# Patient Record
Sex: Female | Born: 1980 | Race: White | Hispanic: No | Marital: Single | Smoking: Never smoker
Health system: Southern US, Community
[De-identification: ages and names within clinical notes are randomized; demographics above are authoritative.]

## PROBLEM LIST (undated history)

## (undated) DIAGNOSIS — G43909 Migraine, unspecified, not intractable, without status migrainosus: Secondary | ICD-10-CM

## (undated) DIAGNOSIS — E738 Other lactose intolerance: Secondary | ICD-10-CM

## (undated) DIAGNOSIS — K589 Irritable bowel syndrome without diarrhea: Secondary | ICD-10-CM

## (undated) DIAGNOSIS — K219 Gastro-esophageal reflux disease without esophagitis: Secondary | ICD-10-CM

## (undated) HISTORY — DX: Other lactose intolerance: E73.8

## (undated) HISTORY — DX: Migraine, unspecified, not intractable, without status migrainosus: G43.909

## (undated) HISTORY — PX: WISDOM TOOTH EXTRACTION: SHX21

---

## 2001-08-07 ENCOUNTER — Other Ambulatory Visit: Admission: RE | Admit: 2001-08-07 | Discharge: 2001-08-07 | Payer: Self-pay | Admitting: Internal Medicine

## 2003-03-12 HISTORY — PX: APPENDECTOMY: SHX54

## 2003-10-03 ENCOUNTER — Encounter: Admission: RE | Admit: 2003-10-03 | Discharge: 2003-10-03 | Payer: Self-pay | Admitting: Internal Medicine

## 2010-04-01 ENCOUNTER — Encounter: Payer: Self-pay | Admitting: Internal Medicine

## 2015-06-08 ENCOUNTER — Ambulatory Visit (INDEPENDENT_AMBULATORY_CARE_PROVIDER_SITE_OTHER): Payer: Managed Care, Other (non HMO) | Admitting: Family Medicine

## 2015-06-08 ENCOUNTER — Encounter: Payer: Self-pay | Admitting: Family Medicine

## 2015-06-08 VITALS — BP 128/82 | HR 85 | Temp 98.5°F | Ht 63.0 in | Wt 164.2 lb

## 2015-06-08 DIAGNOSIS — R5383 Other fatigue: Secondary | ICD-10-CM

## 2015-06-08 DIAGNOSIS — K219 Gastro-esophageal reflux disease without esophagitis: Secondary | ICD-10-CM | POA: Diagnosis not present

## 2015-06-08 DIAGNOSIS — R1013 Epigastric pain: Secondary | ICD-10-CM | POA: Diagnosis not present

## 2015-06-08 NOTE — Progress Notes (Signed)
[facility] [address], [address] [place], [place] [id number] [phone] [phone]  Date:  06/08/2015   Name:  Diane SchatzLauren E Greene   DOB:  Jul 26, 1980   MRN:  [id number]  PCP:  Abbe AmsterdamOPLAND,Rubee Vega, MD    Chief Complaint: Establish Care   History of Present Illness:  Diane SchatzLauren E Greene is a 35 y.o. very pleasant female patient who presents with the following:  She is here today to establish care- she has moved back to [place] recently and needs to find a doctor.   Also, she has noted sx of gastritis with a recent flare up.   On 3/13 she noted heartburn, burping, constipation (which is unusual for her), fatigue, bloating.   She has tried cutting out gluten for the last 2 weeks which did help.    She has had this issue for the last 2 years- she has used prilosec at times which helps temporarily She is on OCP- LMP 3/20  She has never had an upper GI or see gastro She has not been tested for H pylori.    She does get migraines but she has had some other type of headache recently.  Will seem to start with tension in her shoulders and neck, and then will develop pain in her forehead  No vomiting, but she does have some nausea.  At times she may feel like her glucose is low- drinking ginger ale helps her feel better  She states there is no chance of pregnancy.  "i am 100% sure"  There are no active problems to display for this patient.   Past Medical History  Diagnosis Date  . Gastritis   . Other lactose intolerance     dairy sensitivity  . Migraine     Past Surgical History  Procedure Laterality Date  . Appendectomy  2005  . Wisdom tooth extraction      Social History  Substance Use Topics  . Smoking status: Never Smoker   . Smokeless tobacco: Never Used  . Alcohol Use: 0.0 oz/week    0 Standard drinks or equivalent per week     Comment: wine 2-3 drinks monthly    Family History  Problem Relation Age of Onset  . Arthritis Mother    . Arthritis Father     No Known Allergies  Medication list has been reviewed and updated.  No current outpatient prescriptions on file prior to visit.   No current facility-administered medications on file prior to visit.    Review of Systems:  As per HPI- otherwise negative.   Physical Examination: Filed Vitals:   06/08/15 1538  BP: 128/82  Pulse: 85  Temp: 98.5 F (36.9 C)   Filed Vitals:   06/08/15 1538  Height: 5\' 3"  (1.6 m)  Weight: 164 lb 3.2 oz (74.481 kg)   Body mass index is 29.09 kg/(m^2). Ideal Body Weight: Weight in (lb) to have BMI = 25: 140.8  GEN: WDWN, NAD, Non-toxic, A & O x 3, mild overweight, appears healthy  HEENT: Atraumatic, Normocephalic. Neck supple. No masses, No LAD. Ears and Nose: No external deformity. CV: RRR, No M/G/R. No JVD. No thrill. No extra heart sounds. PULM: CTA B, no wheezes, crackles, rhonchi. No retractions. No resp. distress. No accessory muscle use. ABD: S, NT, ND, +BS. No rebound. No HSM.  Benign belly EXTR: No c/c/e NEURO Normal gait.  PSYCH: Normally interactive. Conversant. Not depressed or anxious appearing.  Calm demeanor.  Assessment and Plan: Gastroesophageal reflux disease, esophagitis presence not specified - Plan: H. pylori breath test  Abdominal pain, epigastric - Plan: Amylase  Other fatigue - Plan: CBC with Differential/Platelet, Comprehensive metabolic panel, TSH, B12  Will check basic labs for her today History of vitamin B12 def so will check for her today H pylori testing today- treat if positive, if negative consider GI referral  Signed Abbe Amsterdam, MD

## 2015-06-09 LAB — COMPREHENSIVE METABOLIC PANEL
ALT: 14 U/L (ref 0–35)
AST: 16 U/L (ref 0–37)
Albumin: 4.5 g/dL (ref 3.5–5.2)
Alkaline Phosphatase: 73 U/L (ref 39–117)
BILIRUBIN TOTAL: 0.3 mg/dL (ref 0.2–1.2)
BUN: 12 mg/dL (ref 6–23)
CO2: 28 meq/L (ref 19–32)
CREATININE: 0.73 mg/dL (ref 0.40–1.20)
Calcium: 9.6 mg/dL (ref 8.4–10.5)
Chloride: 101 mEq/L (ref 96–112)
GFR: 97.36 mL/min (ref >60.00)
GLUCOSE: 91 mg/dL (ref 70–99)
Potassium: 4.1 mEq/L (ref 3.5–5.1)
Sodium: 137 mEq/L (ref 135–145)
Total Protein: 7.7 g/dL (ref 6.0–8.3)

## 2015-06-09 LAB — AMYLASE: Amylase: 63 U/L (ref 27–131)

## 2015-06-09 LAB — CBC WITH DIFFERENTIAL/PLATELET
BASOS ABS: 0 10*3/uL (ref 0.0–0.1)
Basophils Relative: 0.4 % (ref 0.0–3.0)
EOS ABS: 0.2 10*3/uL (ref 0.0–0.7)
Eosinophils Relative: 2.7 % (ref 0.0–5.0)
HEMATOCRIT: 38.9 % (ref 36.0–46.0)
Hemoglobin: 13.2 g/dL (ref 12.0–15.0)
LYMPHS ABS: 2.2 10*3/uL (ref 0.7–4.0)
LYMPHS PCT: 29.7 % (ref 12.0–46.0)
MCHC: 34.1 g/dL (ref 30.0–36.0)
MCV: 85.8 fl (ref 78.0–100.0)
Monocytes Absolute: 0.4 10*3/uL (ref 0.1–1.0)
Monocytes Relative: 5.2 % (ref 3.0–12.0)
NEUTROS ABS: 4.6 10*3/uL (ref 1.4–7.7)
NEUTROS PCT: 62 % (ref 43.0–77.0)
PLATELETS: 323 10*3/uL (ref 150.0–400.0)
RBC: 4.53 Mil/uL (ref 3.87–5.11)
RDW: 12.3 % (ref 11.5–15.5)
WBC: 7.4 10*3/uL (ref 4.0–10.5)

## 2015-06-09 LAB — H. PYLORI BREATH TEST: H. pylori Breath Test: NOT DETECTED

## 2015-06-09 LAB — TSH: TSH: 1.41 u[IU]/mL (ref 0.35–4.50)

## 2015-06-09 LAB — VITAMIN B12: Vitamin B-12: 329 pg/mL (ref 211–911)

## 2015-06-09 NOTE — Addendum Note (Signed)
Addended by: Abbe AmsterdamOPLAND, Burleigh Brockmann C on: 06/09/2015 07:33 PM   Modules accepted: Orders

## 2015-06-13 ENCOUNTER — Encounter: Payer: Self-pay | Admitting: Gastroenterology

## 2015-08-09 ENCOUNTER — Other Ambulatory Visit (INDEPENDENT_AMBULATORY_CARE_PROVIDER_SITE_OTHER): Payer: Managed Care, Other (non HMO)

## 2015-08-09 ENCOUNTER — Ambulatory Visit (INDEPENDENT_AMBULATORY_CARE_PROVIDER_SITE_OTHER): Payer: Managed Care, Other (non HMO) | Admitting: Gastroenterology

## 2015-08-09 ENCOUNTER — Encounter: Payer: Self-pay | Admitting: Gastroenterology

## 2015-08-09 VITALS — BP 122/74 | HR 96 | Ht 63.0 in | Wt 163.0 lb

## 2015-08-09 DIAGNOSIS — K219 Gastro-esophageal reflux disease without esophagitis: Secondary | ICD-10-CM

## 2015-08-09 DIAGNOSIS — R140 Abdominal distension (gaseous): Secondary | ICD-10-CM

## 2015-08-09 DIAGNOSIS — K5909 Other constipation: Secondary | ICD-10-CM

## 2015-08-09 LAB — IGA: IGA: 136 mg/dL (ref 68–378)

## 2015-08-09 MED ORDER — RANITIDINE HCL 150 MG PO TABS: 150.0000 mg | ORAL_TABLET | Freq: Two times a day (BID) | ORAL | Status: AC | PRN

## 2015-08-09 MED ORDER — POLYETHYLENE GLYCOL 3350 17 GM/SCOOP PO POWD: 1.0000 | Freq: Every day | ORAL | Status: AC

## 2015-08-09 NOTE — Patient Instructions (Signed)
Your physician has requested that you go to the basement for the following lab work before leaving today:  TTG, IGA  We have sent the following medications to your pharmacy for you to pick up at your convenience:  Miralax, Zantac  You have been given a copy of the FODMAP diet.

## 2015-08-09 NOTE — Progress Notes (Signed)
HPI :  35 y/o female here for a new patient visit for possible IBS / GERD. She has a history of migraine headaches.   Patient reports longstanding "intestinal issues" which have bothered her.  She thinks was told she had IBS when she was in her 4s. She has been seen in urgent care a few years ago for epigastric pain and was told she had "gastritis" although has never had a prior endoscopy.    She does have some pyrosis which bothers her from time to time. Denies regurgitation. No dysphagia. No odynophagia. She has some nausea roughly one day per week, some decrease appetitie with this. No vomiting. She thinks she has lost 10 lbs but trying to lose weight. She has a lot of gas and bloating, with some belching at times. She thinks usually after she eats she can have these symptoms. She also has some "burning" in her epigastric area which can come and go. She has not felt it in a few months but has been on a gluten free diet and not sure if it is related. She usually had the epigastric burning at nighttime, which could bother her sleeping. She has had some nocturnal pyrosis at the same time.  She has roughly one BM per day at baseline. She previously had loose stools but states she has stopped dairy and this has resolved, and now on the constipated side. She takes Allolax PRN for constipation. No blood in the stools. No prior endoscopies. She has some occasiona lower addominal cramps that are relieved reliably with a bowel movement and is stable.   She reports a remote diagnosis of "colitis" in 2005 following her appendectomy, thought to be infectious and treated with antibiotics. Uncle has Crohns disease. Cousin has had colectomy at a young age, unclear etiology. No FH of celiac disease or colon cancer.   She hasn't taken anything for her symptoms, no antacids. She has severe cramps with menses, has menstrual cycle every 3 months, taking Aleve during that time but not otherwise.     Past Medical  History  Diagnosis Date  . Other lactose intolerance     dairy sensitivity  . Migraine      Past Surgical History  Procedure Laterality Date  . Appendectomy  2005  . Wisdom tooth extraction     Family History  Problem Relation Age of Onset  . Arthritis Mother   . Arthritis Father   . Irritable bowel syndrome Sister   . Irritable bowel syndrome Maternal Aunt   . Crohn's disease Paternal Uncle    Social History  Substance Use Topics  . Smoking status: Never Smoker   . Smokeless tobacco: Never Used  . Alcohol Use: 0.0 oz/week    0 Standard drinks or equivalent per week     Comment: wine 2-3 drinks monthly   Current Outpatient Prescriptions  Medication Sig Dispense Refill  . QUASENSE 0.15-0.03 MG tablet Take 1 tablet by mouth daily.  3  . polyethylene glycol powder (GLYCOLAX/MIRALAX) powder Take 255 g by mouth daily. 255 g 3  . ranitidine (ZANTAC) 150 MG tablet Take 1 tablet (150 mg total) by mouth every 12 (twelve) hours as needed for heartburn. 60 tablet 3   No current facility-administered medications for this visit.   No Known Allergies   Review of Systems: All systems reviewed and negative except where noted in HPI.   Lab Results  Component Value Date   WBC 7.4 06/08/2015   HGB 13.2 06/08/2015  HCT 38.9 06/08/2015   MCV 85.8 06/08/2015   PLT 323.0 06/08/2015    Lab Results  Component Value Date   CREATININE 0.73 06/08/2015   BUN 12 06/08/2015   NA 137 06/08/2015   K 4.1 06/08/2015   CL 101 06/08/2015   CO2 28 06/08/2015   Lab Results  Component Value Date   ALT 14 06/08/2015   AST 16 06/08/2015   ALKPHOS 73 06/08/2015   BILITOT 0.3 06/08/2015   H pylori breath test negative March 2017  Physical Exam: BP 122/74 mmHg  Pulse 96  Ht '5\' 3"'$  (1.6 m)  Wt 163 lb (73.936 kg)  BMI 28.88 kg/m2  LMP 05/22/2015 (Approximate) Constitutional: Pleasant,well-developed, female in no acute distress. HEENT: Normocephalic and atraumatic. Conjunctivae are  normal. No scleral icterus. Neck supple.  Cardiovascular: Normal rate, regular rhythm.  Pulmonary/chest: Effort normal and breath sounds normal. No wheezing, rales or rhonchi. Abdominal: Soft, nondistended, nontender. Bowel sounds active throughout. There are no masses palpable. No hepatomegaly. Extremities: no edema Lymphadenopathy: No cervical adenopathy noted. Neurological: Alert and oriented to person place and time. Skin: Skin is warm and dry. No rashes noted. Psychiatric: Normal mood and affect. Behavior is normal.   ASSESSMENT AND PLAN: 35 y/o female with history as above, presenting with intermittent pyrosis / epigastric burning, bloating, and longstanding bowel changes. She has no anemia or alarm features. She has tested negative for H pylori. I suspect GERD may be causing her upper tract symptoms, and she otherwise meets criteria for IBS. Recommend the following:  GERD - discussed options with her, given intermittent nature of symptoms and she doesn't wish to take medication daily, will try zantac PRN. If no improvement we can try PPI. If symptoms persist will consider endoscopy  Suspected IBS / Bloating - improved on gluten free diet but she does endorse gluten ingestion from time to time. Will screen with ABs for celiac. If negative and symptoms persist may consider HLA testing or endoscopy if celiac needs to be more definitively ruled out. We will otherwise try a low FODMOP diet which I counseled her on, and for mild constipation will use Miralax PRN.   She can follow up as needed for persistent symptoms.   Menlo Cellar, MD [facility] Pager [phone]  CC: Copland, Gay Filler, MD

## 2015-08-10 LAB — TISSUE TRANSGLUTAMINASE, IGA: Tissue Transglutaminase Ab, IgA: 1 U/mL (ref <4)

## 2015-09-07 ENCOUNTER — Encounter: Payer: Self-pay | Admitting: *Deleted

## 2015-09-07 ENCOUNTER — Telehealth: Payer: Self-pay | Admitting: Gastroenterology

## 2015-09-07 DIAGNOSIS — R1030 Lower abdominal pain, unspecified: Secondary | ICD-10-CM

## 2015-09-07 NOTE — Telephone Encounter (Signed)
I'm surprised if urgent care thought she had diverticulitis they did not perform a CT scan. Further, amoxicillin is not the treatment of choice for diverticulitis and I'm not sure if they are treating her for something else. Without seeing her in clinic it is hard to say if she needs this. Did they do any blood work? If she has severe pain I guess we can order the CT scan if she wants it but I am not in the office to review the results this afternoon if it is done today.

## 2015-09-07 NOTE — Telephone Encounter (Signed)
Patient aware.

## 2015-09-07 NOTE — Telephone Encounter (Signed)
Ok, if fever or worsening pain, inability to tolerate PO, she will need to go to the ER to get it done sooner. Thanks

## 2015-09-07 NOTE — Telephone Encounter (Signed)
Patient given recommendations. She states her pain is an 8 now. She last ate at 11 today. She wants to have a CT. Left a message for Stacy at [facility] to call back.

## 2015-09-07 NOTE — Telephone Encounter (Signed)
Patient states she woke up at 5 AM with Left side abdominal pain that radiates to lower back. She reports diarrhea and nausea without fever. She went to an Urgent Care this AM for what she thought was a UTI and was told they think it is diverticulitis and that she needed a CT. The urgent care did not order a CT. They gave her Amoxicillin. She is asking if Dr. Adela LankArmbruster would want her to have a CT and if he would order it. Please, advise.

## 2015-09-07 NOTE — Telephone Encounter (Signed)
Patient calling back regarding this. Best # [phone]

## 2015-09-07 NOTE — Telephone Encounter (Signed)
Patient wants to proceed with CT. Scheduled CT at [facility] CT 09/08/15 at 3:30 PM. Patient will pick up contrast and instructions. She will do clear liquids x 24 hours.

## 2015-09-08 ENCOUNTER — Other Ambulatory Visit: Payer: Self-pay | Admitting: *Deleted

## 2015-09-08 ENCOUNTER — Ambulatory Visit (INDEPENDENT_AMBULATORY_CARE_PROVIDER_SITE_OTHER)
Admission: RE | Admit: 2015-09-08 | Discharge: 2015-09-08 | Disposition: A | Discharge status: Home / Self Care | Payer: BLUE CROSS/BLUE SHIELD | Source: Ambulatory Visit | Attending: Gastroenterology | Admitting: Gastroenterology

## 2015-09-08 DIAGNOSIS — R1030 Lower abdominal pain, unspecified: Secondary | ICD-10-CM

## 2015-09-08 MED ORDER — DICYCLOMINE HCL 10 MG PO CAPS: ORAL_CAPSULE | ORAL | Status: DC

## 2015-09-08 MED ORDER — IOPAMIDOL (ISOVUE-300) INJECTION 61%
100.0000 mL | Freq: Once | INTRAVENOUS | Status: AC | PRN
  Administered 2015-09-08: 100 mL via INTRAVENOUS

## 2015-11-15 ENCOUNTER — Ambulatory Visit (INDEPENDENT_AMBULATORY_CARE_PROVIDER_SITE_OTHER): Payer: BLUE CROSS/BLUE SHIELD | Admitting: Family Medicine

## 2015-11-15 ENCOUNTER — Encounter: Payer: Self-pay | Admitting: Family Medicine

## 2015-11-15 VITALS — BP 114/82 | HR 75 | Temp 98.1°F | Ht 64.0 in | Wt 167.4 lb

## 2015-11-15 DIAGNOSIS — Z130 Encounter for screening for diseases of the blood and blood-forming organs and certain disorders involving the immune mechanism: Secondary | ICD-10-CM

## 2015-11-15 DIAGNOSIS — Z0001 Encounter for general adult medical examination with abnormal findings: Secondary | ICD-10-CM | POA: Diagnosis not present

## 2015-11-15 DIAGNOSIS — Z131 Encounter for screening for diabetes mellitus: Secondary | ICD-10-CM | POA: Diagnosis not present

## 2015-11-15 DIAGNOSIS — F40243 Fear of flying: Secondary | ICD-10-CM

## 2015-11-15 DIAGNOSIS — Z13220 Encounter for screening for lipoid disorders: Secondary | ICD-10-CM

## 2015-11-15 DIAGNOSIS — E663 Overweight: Secondary | ICD-10-CM

## 2015-11-15 DIAGNOSIS — Z23 Encounter for immunization: Secondary | ICD-10-CM | POA: Diagnosis not present

## 2015-11-15 DIAGNOSIS — Z0000 Encounter for general adult medical examination without abnormal findings: Secondary | ICD-10-CM

## 2015-11-15 LAB — CBC
HCT: 39.4 % (ref 36.0–46.0)
Hemoglobin: 13.3 g/dL (ref 12.0–15.0)
MCHC: 33.7 g/dL (ref 30.0–36.0)
MCV: 87.2 fl (ref 78.0–100.0)
PLATELETS: 311 10*3/uL (ref 150.0–400.0)
RBC: 4.52 Mil/uL (ref 3.87–5.11)
RDW: 12.8 % (ref 11.5–15.5)
WBC: 5.5 10*3/uL (ref 4.0–10.5)

## 2015-11-15 LAB — LIPID PANEL
CHOL/HDL RATIO: 6
Cholesterol: 265 mg/dL — ABNORMAL HIGH (ref 0–200)
HDL: 42.1 mg/dL (ref >39.00)
LDL Cholesterol: 186 mg/dL — ABNORMAL HIGH (ref 0–99)
NONHDL: 223.18
TRIGLYCERIDES: 187 mg/dL — AB (ref 0.0–149.0)
VLDL: 37.4 mg/dL (ref 0.0–40.0)

## 2015-11-15 LAB — COMPREHENSIVE METABOLIC PANEL
ALK PHOS: 60 U/L (ref 39–117)
ALT: 21 U/L (ref 0–35)
AST: 22 U/L (ref 0–37)
Albumin: 4.2 g/dL (ref 3.5–5.2)
BILIRUBIN TOTAL: 0.3 mg/dL (ref 0.2–1.2)
BUN: 9 mg/dL (ref 6–23)
CALCIUM: 8.9 mg/dL (ref 8.4–10.5)
CO2: 26 meq/L (ref 19–32)
CREATININE: 0.7 mg/dL (ref 0.40–1.20)
Chloride: 104 mEq/L (ref 96–112)
GFR: 101.92 mL/min (ref >60.00)
Glucose, Bld: 96 mg/dL (ref 70–99)
Potassium: 4.2 mEq/L (ref 3.5–5.1)
Sodium: 136 mEq/L (ref 135–145)
TOTAL PROTEIN: 7.3 g/dL (ref 6.0–8.3)

## 2015-11-15 LAB — HEMOGLOBIN A1C: Hgb A1c MFr Bld: 5.3 % (ref 4.6–6.5)

## 2015-11-15 MED ORDER — ALPRAZOLAM 0.25 MG PO TABS: 0.2500 mg | ORAL_TABLET | Freq: Three times a day (TID) | ORAL | 0 refills | Status: DC | PRN

## 2015-11-15 NOTE — Patient Instructions (Signed)
It was very nice to see you today- have a great time on your upcoming trip! You got your tetanus (TDAP) and flu shots today I will be in touch with the rest of your labs asap Continue a healthy lifestyle; try to eat non- processed foods and exercise most days of the week Your BP looks fine.    Wt Readings from Last 3 Encounters:  11/15/15 167 lb 6.4 oz (75.9 kg)  08/09/15 163 lb (73.9 kg)  06/08/15 164 lb 3.2 oz (74.5 kg)   Use the xanax sparingly as needed for fear of flying- remember it will make you sleepy!

## 2015-11-15 NOTE — Progress Notes (Signed)
[facility] [address] [place], [place] [address] [phone]  Date:  11/15/2015   Name:  Diane Greene   DOB:  May 18, 1980   MRN:  [id number]  PCP:  Diane Amsterdam, MD    Chief Complaint: Annual Exam (Pt is fasting for lab work. Will get Tetanus and flu vaccines today. Would like rx for lowest dose of Xanax to use for flying. )   History of Present Illness:  Diane Greene is a 35 y.o. very pleasant female patient who presents with the following:  She is here today for a CPE Seen in March for some stomach concerns- she saw GI, was tested for celiac and then diverticulitis- had a negative CT just recently when she was having pain and there was concern for diverticulitis She does see OBG at [facility] [place]- they do her pap and breast exams She would like to have a tetanus and flu shot. She is doing on a mission trip to [place], [place] pretty soon and wants to make sure she is UTD. She is going with a group- she does not think there is any need tor malaria prophylaxis.  She is going to the state of [place]- no malaria risk there per CDC  She was exposed to strep recently- however she does not have any sx herself. She will watch for any fever or ST Fasting today for labs She is s/p hep B vaccine series in middle school Offered typhoid and Hep A vaccine prior to hertrip- she declines   She would like to have some xanax on hand for fear of flying- she has used this in the past without ill effect, does not use except for this use.  No entries for her on NCCSR   There are no active problems to display for this patient.   Past Medical History:  Diagnosis Date  . Migraine   . Other lactose intolerance    dairy sensitivity    Past Surgical History:  Procedure Laterality Date  . APPENDECTOMY  2005  . WISDOM TOOTH EXTRACTION      Social History  Substance Use Topics  . Smoking status: Never Smoker  . Smokeless tobacco:  Never Used  . Alcohol use 0.0 oz/week     Comment: wine 2-3 drinks monthly    Family History  Problem Relation Age of Onset  . Arthritis Mother   . Arthritis Father   . Irritable bowel syndrome Sister   . Irritable bowel syndrome Maternal Aunt   . Crohn's disease Paternal Uncle     No Known Allergies  Medication list has been reviewed and updated.  Current Outpatient Prescriptions on File Prior to Visit  Medication Sig Dispense Refill  . polyethylene glycol powder (GLYCOLAX/MIRALAX) powder Take 255 g by mouth daily. 255 g 3  . QUASENSE 0.15-0.03 MG tablet Take 1 tablet by mouth daily.  3  . ranitidine (ZANTAC) 150 MG tablet Take 1 tablet (150 mg total) by mouth every 12 (twelve) hours as needed for heartburn. 60 tablet 3   No current facility-administered medications on file prior to visit.     Review of Systems:  As per HPI- otherwise negative.   Physical Examination: Vitals:   11/15/15 0938  BP: 114/82  Pulse: 75  Temp: 98.1 F (36.7 C)   Vitals:   11/15/15 0938  Weight: 167 lb 6.4 oz (75.9 kg)  Height: 5\' 4"  (1.626 m)   Body mass  index is 28.73 kg/m. Ideal Body Weight: Weight in (lb) to have BMI = 25: 145.3  GEN: WDWN, NAD, Non-toxic, A & O x 3, mild overweight, looks well HEENT: Atraumatic, Normocephalic. Neck supple. No masses, No LAD.  Bilateral TM wnl, oropharynx normal.  PEERL,EOMI.   Ears and Nose: No external deformity. CV: RRR, No M/G/R. No JVD. No thrill. No extra heart sounds. PULM: CTA B, no wheezes, crackles, rhonchi. No retractions. No resp. distress. No accessory muscle use. ABD: S, NT, ND, +BS. No rebound. No HSM. EXTR: No c/c/e NEURO Normal gait.  PSYCH: Normally interactive. Conversant. Not depressed or anxious appearing.  Calm demeanor.    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  It was very nice to see you today- have a great time on your upcoming trip! You got your tetanus (TDAP) and flu shots today I will be in touch with the rest of your labs asap Continue a healthy lifestyle; try to eat non- processed foods and exercise most days of the week Your BP looks fine.    Wt Readings from Last 3 Encounters:  11/15/15 167 lb 6.4 oz (75.9 kg)  08/09/15 163 lb (73.9 kg)  06/08/15 164 lb 3.2 oz (74.5 kg)   Use the xanax sparingly as needed for fear of flying- remember it will make you sleepy  Signed Diane AmsterdamJessica Lateshia Schmoker, MD

## 2015-11-15 NOTE — Progress Notes (Signed)
Pre visit review using our clinic review tool, if applicable. No additional management support is needed unless otherwise documented below in the visit note. 

## 2016-03-21 ENCOUNTER — Emergency Department (HOSPITAL_COMMUNITY): Payer: BLUE CROSS/BLUE SHIELD

## 2016-03-21 ENCOUNTER — Telehealth: Payer: Self-pay | Admitting: Family Medicine

## 2016-03-21 ENCOUNTER — Encounter (HOSPITAL_COMMUNITY): Payer: Self-pay | Admitting: Emergency Medicine

## 2016-03-21 ENCOUNTER — Emergency Department (HOSPITAL_COMMUNITY)
Admission: EM | Admit: 2016-03-21 | Discharge: 2016-03-21 | Disposition: A | Discharge status: Home / Self Care | Payer: BLUE CROSS/BLUE SHIELD | Attending: Emergency Medicine | Admitting: Emergency Medicine

## 2016-03-21 ENCOUNTER — Ambulatory Visit (HOSPITAL_COMMUNITY)
Admission: EM | Admit: 2016-03-21 | Discharge: 2016-03-21 | Disposition: A | Discharge status: Nursing Home (Includes ICF) | Payer: BLUE CROSS/BLUE SHIELD | Attending: Emergency Medicine | Admitting: Emergency Medicine

## 2016-03-21 DIAGNOSIS — R002 Palpitations: Secondary | ICD-10-CM

## 2016-03-21 DIAGNOSIS — R0789 Other chest pain: Secondary | ICD-10-CM | POA: Diagnosis not present

## 2016-03-21 HISTORY — DX: Gastro-esophageal reflux disease without esophagitis: K21.9

## 2016-03-21 HISTORY — DX: Irritable bowel syndrome, unspecified: K58.9

## 2016-03-21 LAB — BASIC METABOLIC PANEL
ANION GAP: 10 (ref 5–15)
BUN: 7 mg/dL (ref 6–20)
CALCIUM: 9.4 mg/dL (ref 8.9–10.3)
CHLORIDE: 107 mmol/L (ref 101–111)
CO2: 20 mmol/L — AB (ref 22–32)
Creatinine, Ser: 0.66 mg/dL (ref 0.44–1.00)
GFR calc Af Amer: >60 mL/min (ref >60)
GFR calc non Af Amer: >60 mL/min (ref >60)
GLUCOSE: 102 mg/dL — AB (ref 65–99)
POTASSIUM: 3.7 mmol/L (ref 3.5–5.1)
Sodium: 137 mmol/L (ref 135–145)

## 2016-03-21 LAB — CBC
HEMATOCRIT: 39.5 % (ref 36.0–46.0)
HEMOGLOBIN: 13.5 g/dL (ref 12.0–15.0)
MCH: 29.2 pg (ref 26.0–34.0)
MCHC: 34.2 g/dL (ref 30.0–36.0)
MCV: 85.5 fL (ref 78.0–100.0)
Platelets: 298 10*3/uL (ref 150–400)
RBC: 4.62 MIL/uL (ref 3.87–5.11)
RDW: 12.3 % (ref 11.5–15.5)
WBC: 6.1 10*3/uL (ref 4.0–10.5)

## 2016-03-21 LAB — I-STAT TROPONIN, ED: Troponin i, poc: 0 ng/mL (ref 0.00–0.08)

## 2016-03-21 NOTE — Telephone Encounter (Signed)
Per chart, pt was placed on Dr. Cyndie Chimeopland's schedule for Monday, January 15th at 9:15 am.  Message routed to Dr. Patsy Lageropland for FYI.

## 2016-03-21 NOTE — ED Triage Notes (Signed)
Here for intermittent heart palpitations onset 1 month   Sx usually occur while asleep... yest she had some dizziness, SOB, nauseas, CP on left side  Reports brother w/hx of arhythmia    A&O x4... NAD... Talking in complete sentences.

## 2016-03-21 NOTE — ED Triage Notes (Signed)
Pt sent from urgent care center with c/o's palpitations that have been happening at night, worse last night-- happened 3 times, awakening from sleep-- happened also yesterday midday-- became dizzy, nauseated, diaphoretic-- took pulse at that time -- 112.  Denies any increase in caffeine, chocolate, stress.

## 2016-03-21 NOTE — Discharge Instructions (Signed)
Please go straight to ED °

## 2016-03-21 NOTE — ED Provider Notes (Signed)
[facility]-EMERGENCY DEPT Provider Note   CSN: [id number] Arrival date & time: 03/21/16  1303     History   Chief Complaint Chief Complaint  Patient presents with  . Palpitations  . sent from [facility]    HPI Diane Greene is a 36 y.o. female.  Diane Greene is a 36 y.o. Female who presents to the ED complaining of intermittent palpitations for the past three week. She reports these have woken her from her sleep a couple times and she reports her Apple Watch will report her heart rate is between 100-120 during this time. She reports at times she will feel short of breath with the palpitations. She reports this does not happen every night. She reports it occurred 3 times last night. She also reports this happened once today when she was told she needed to go to the ER to be evaluated. She denies any increased stress, or alcohol use. She denies any chest pain or shortness breath today or when walking into her room from the waiting room. She denies any current palpitations. She denies personal history of PE, DVT or MI. She denies personal or close family history of blood clotting disorders such as factor V Leiden, protein C or S deficiency. She denies hemoptysis, leg pain, leg swelling, recent long travel, or recent surgery. Patient denies fevers, coughing, wheezing, abdominal pain, nausea, vomiting, alcohol use, leg pain, leg swelling, rashes, syncope, lightheadedness.    The history is provided by the patient. No language interpreter was used.  Palpitations   Pertinent negatives include no fever, no chest pain, no abdominal pain, no nausea, no vomiting, no headaches, no back pain, no weakness, no cough and no shortness of breath.    Past Medical History:  Diagnosis Date  . Acid reflux   . Irritable bowel   . Migraine   . Other lactose intolerance    dairy sensitivity    Patient Active Problem List   Diagnosis Date Noted  . Overweight 11/15/2015    Past Surgical History:  Procedure  Laterality Date  . APPENDECTOMY  2005  . WISDOM TOOTH EXTRACTION      OB History    No data available       Home Medications    Prior to Admission medications   Medication Sig Start Date End Date Taking? Authorizing Provider  ALPRAZolam (XANAX) 0.25 MG tablet Take 1 tablet (0.25 mg total) by mouth 3 (three) times daily as needed for anxiety. For fear of flying 11/15/15   Pearline Cables, MD  polyethylene glycol powder (GLYCOLAX/MIRALAX) powder Take 255 g by mouth daily. 08/09/15   Ruffin Frederick, MD  QUASENSE 0.15-0.03 MG tablet Take 1 tablet by mouth daily. 05/27/15   Historical Provider, MD  ranitidine (ZANTAC) 150 MG tablet Take 1 tablet (150 mg total) by mouth every 12 (twelve) hours as needed for heartburn. 08/09/15   Ruffin Frederick, MD    Family History Family History  Problem Relation Age of Onset  . Arthritis Mother   . Arthritis Father   . Irritable bowel syndrome Sister   . Irritable bowel syndrome Maternal Aunt   . Crohn's disease Paternal Uncle     Social History Social History  Substance Use Topics  . Smoking status: Never Smoker  . Smokeless tobacco: Never Used  . Alcohol use 0.0 oz/week     Comment: wine 2-3 drinks monthly     Allergies   Patient has no known allergies.   Review of Systems  Review of Systems  Constitutional: Negative for chills and fever.  HENT: Negative for congestion and sore throat.   Eyes: Negative for visual disturbance.  Respiratory: Negative for cough, shortness of breath and wheezing.   Cardiovascular: Positive for palpitations. Negative for chest pain and leg swelling.  Gastrointestinal: Negative for abdominal pain, diarrhea, nausea and vomiting.  Genitourinary: Negative for dysuria.  Musculoskeletal: Negative for back pain and neck pain.  Skin: Negative for rash.  Neurological: Negative for syncope, weakness, light-headedness and headaches.     Physical Exam Updated Vital Signs BP 143/94 (BP Location:  Left Arm)   Pulse 87   Temp 99 F (37.2 C) (Oral)   Resp 18   Ht 5\' 3"  (1.6 m)   Wt 75.8 kg   SpO2 97%   BMI 29.58 kg/m   Physical Exam  Constitutional: She appears well-developed and well-nourished. No distress.  Nontoxic appearing.  HENT:  Head: Normocephalic and atraumatic.  Mouth/Throat: Oropharynx is clear and moist.  Eyes: Conjunctivae are normal. Pupils are equal, round, and reactive to light. Right eye exhibits no discharge. Left eye exhibits no discharge.  Neck: Normal range of motion. Neck supple. No JVD present. No tracheal deviation present.  Cardiovascular: Normal rate, regular rhythm, normal heart sounds and intact distal pulses.  Exam reveals no gallop and no friction rub.   No murmur heard. Bilateral radial pulses are intact.   Pulmonary/Chest: Effort normal and breath sounds normal. No stridor. No respiratory distress. She has no wheezes. She has no rales. She exhibits no tenderness.  Lungs clear to auscultation bilaterally. Symmetric chest expansion bilaterally. No increased work of breathing. No rales or rhonchi.  Abdominal: Soft. There is no tenderness. There is no guarding.  Musculoskeletal: She exhibits no edema or tenderness.  No lower extremity edema or tenderness.  Lymphadenopathy:    She has no cervical adenopathy.  Neurological: She is alert. Coordination normal.  Normal gait.  Skin: Skin is warm and dry. Capillary refill takes less than 2 seconds. No rash noted. She is not diaphoretic. No erythema. No pallor.  Psychiatric: She has a normal mood and affect. Her behavior is normal.  Nursing note and vitals reviewed.    ED Treatments / Results  Labs (all labs ordered are listed, but only abnormal results are displayed) Labs Reviewed  BASIC METABOLIC PANEL - Abnormal; Notable for the following:       Result Value   CO2 20 (*)    Glucose, Bld 102 (*)    All other components within normal limits  CBC  I-STAT TROPOININ, ED    EKG  EKG  Interpretation  Date/Time:  Thursday March 21 2016 13:12:40 EST Ventricular Rate:  73 PR Interval:  118 QRS Duration: 86 QT Interval:  382 QTC Calculation: 420 R Axis:   90 Text Interpretation:  Normal sinus rhythm with sinus arrhythmia Rightward axis Borderline ECG same as earlier in day Confirmed by [facility] QuitmanMESNER MD, JASON [id number]) on 03/21/2016 3:46:28 PM       Radiology Dg Chest 2 View  Result Date: 03/21/2016 CLINICAL DATA:  Palpitations. EXAM: CHEST  2 VIEW COMPARISON:  No recent prior. FINDINGS: Pectus deformity . Mediastinum and hilar structures are normal. Mild basilar subsegmental atelectasis. No pleural effusion pneumothorax. Heart size normal. IMPRESSION: Mild basilar subsegmental atelectasis. Electronically Signed   By: Maisie Fushomas  Register   On: 03/21/2016 14:25    Procedures Procedures (including critical care time)  Medications Ordered in ED Medications - No data to display   Initial  Impression / Assessment and Plan / ED Course  I have reviewed the triage vital signs and the nursing notes.  Pertinent labs & imaging results that were available during my care of the patient were reviewed by me and considered in my medical decision making (see chart for details).  Clinical Course    This is a 36 y.o. Female who presents to the ED complaining of intermittent palpitations for the past three week. She reports these have woken her from her sleep a couple times and she reports her Apple Watch will report her heart rate is between 100-120 during this time. She reports at times she will feel short of breath with the palpitations. She reports this does not happen every night. She reports it occurred 3 times last night. She also reports this happened once today when she was told she needed to go to the ER to be evaluated. She denies any increased stress, or alcohol use. She denies any chest pain or shortness breath today or when walking into her room from the waiting room. She denies any  current palpitations. On exam patient is afebrile nontoxic appearing. Lungs clear to auscultation bilaterally. Normal gait. No increased work of breathing. No rales or rhonchi. Troponin is 0. BMP is unremarkable. CBC is within normal limits. Chest x-ray shows mild bibasilar atelectasis and is otherwise unremarkable. EKG shows normal sinus rhythm. She's had normal heart rate while in the emergency department. No palpitations while she's been in the emergency department. It is reassuring that her heart rate is between 100 and 120 on her watch with these palpitations. Likely sinus rhythm. Patient will need follow-up with cardiology. No acute findings are today. She is Wells criteria negative. I have low suspicion for ACS or PE. I advised the patient to follow-up with their primary care provider this week. I advised the patient to return to the emergency department with new or worsening symptoms or new concerns. The patient verbalized understanding and agreement with plan.   This patient was discussed with Dr. Clayborne Dana who agrees with assessment and plan.   Final Clinical Impressions(s) / ED Diagnoses   Final diagnoses:  Intermittent palpitations    New Prescriptions Discharge Medication List as of 03/21/2016  3:48 PM       Everlene Farrier, PA-C 03/21/16 1628    Marily Memos, MD 03/21/16 2141

## 2016-03-21 NOTE — Telephone Encounter (Signed)
[facility] Day - Client TELEPHONE ADVICE RECORD [facility] Patient Name: Diane EvaLAUREN DEREDGER DOB: May 17, 1980 Initial Comment Caller states, she is having heart palps. Appointment for Monday. Also shortness of breath. Nurse Assessment Nurse: Edmon Crapeipton, RN, Amy Date/Time Lamount Cohen(Eastern Time): 03/21/2016 9:31:49 AM Confirm and document reason for call. If symptomatic, describe symptoms. ---Caller states she has been having heart palpitations for years. States it seems worse for the past month, waking her up out of sleep. Does the patient have any new or worsening symptoms? ---Yes Will a triage be completed? ---Yes Related visit to physician within the last 2 weeks? ---No Does the PT have any chronic conditions? (i.e. diabetes, asthma, etc.) ---Yes List chronic conditions. ---heart palpitations, acid reflux Is the patient pregnant or possibly pregnant? (Ask all females between the ages of 6012-55) ---No Is this a behavioral health or substance abuse call? ---No Guidelines Guideline Title Affirmed Question Affirmed Notes Chest Pain [1] Intermittent chest pain or "angina" AND [2] increasing in severity or frequency (Exception: pains lasting a few seconds) Final Disposition User Go to ED Now Tipton, RN, Amy Comments Caller did not decline but does state she may call the office back for an appt instead. Referrals GO TO FACILITY UNDECIDED Disagree/Comply: Comply

## 2016-03-21 NOTE — ED Provider Notes (Signed)
CSN: [id number]     Arrival date & time 03/21/16  1104 History   None    Chief Complaint  Patient presents with  . Palpitations   (Consider location/radiation/quality/duration/timing/severity/associated sxs/prior Treatment) Patient is c/o palpitations and tachycardia that awakens her.  She states she has left sided chest pain 5 on scale of 1-10.  She is having    The history is provided by the patient.  Palpitations  Palpitations quality:  Fast Onset quality:  Sudden Duration:  1 day Timing:  Intermittent Progression:  Worsening Chronicity:  New Context: anxiety   Relieved by: xanax. Worsened by:  Nothing Ineffective treatments:  None tried Associated symptoms: chest pain and chest pressure     Past Medical History:  Diagnosis Date  . Migraine   . Other lactose intolerance    dairy sensitivity   Past Surgical History:  Procedure Laterality Date  . APPENDECTOMY  2005  . WISDOM TOOTH EXTRACTION     Family History  Problem Relation Age of Onset  . Arthritis Mother   . Arthritis Father   . Irritable bowel syndrome Sister   . Irritable bowel syndrome Maternal Aunt   . Crohn's disease Paternal Uncle    Social History  Substance Use Topics  . Smoking status: Never Smoker  . Smokeless tobacco: Never Used  . Alcohol use 0.0 oz/week     Comment: wine 2-3 drinks monthly   OB History    No data available     Review of Systems  Constitutional: Negative.   HENT: Negative.   Eyes: Negative.   Respiratory: Negative.   Cardiovascular: Positive for chest pain and palpitations.  Endocrine: Negative.   Genitourinary: Negative.   Musculoskeletal: Negative.   Skin: Negative.   Allergic/Immunologic: Negative.   Neurological: Negative.   Hematological: Negative.   Psychiatric/Behavioral: Negative.     Allergies  Patient has no known allergies.  Home Medications   Prior to Admission medications   Medication Sig Start Date End Date Taking? Authorizing Provider   ALPRAZolam (XANAX) 0.25 MG tablet Take 1 tablet (0.25 mg total) by mouth 3 (three) times daily as needed for anxiety. For fear of flying 11/15/15  Yes Gwenlyn Found Copland, MD  QUASENSE 0.15-0.03 MG tablet Take 1 tablet by mouth daily. 05/27/15  Yes Historical Provider, MD  polyethylene glycol powder (GLYCOLAX/MIRALAX) powder Take 255 g by mouth daily. 08/09/15   Ruffin Frederick, MD  ranitidine (ZANTAC) 150 MG tablet Take 1 tablet (150 mg total) by mouth every 12 (twelve) hours as needed for heartburn. 08/09/15   Ruffin Frederick, MD   Meds Ordered and Administered this Visit  Medications - No data to display  BP 154/92 (BP Location: Left Arm)   Pulse 84   Temp 98.8 F (37.1 C) (Oral)   Resp 20   SpO2 100%  No data found.   Physical Exam  Constitutional: She appears well-developed and well-nourished.  HENT:  Head: Normocephalic and atraumatic.  Eyes: EOM are normal. Pupils are equal, round, and reactive to light.  Neck: Normal range of motion. Neck supple.  Cardiovascular: Normal rate, regular rhythm and normal heart sounds.   Pulmonary/Chest: Effort normal.  Abdominal: Soft. Bowel sounds are normal.  Nursing note and vitals reviewed.   Urgent Care Course   Clinical Course     Procedures (including critical care time)  Labs Review Labs Reviewed - No data to display  Imaging Review No results found.   Visual Acuity Review  Right Eye Distance:  Left Eye Distance:   Bilateral Distance:    Right Eye Near:   Left Eye Near:    Bilateral Near:         MDM   1. Other chest pain   2. Palpitations    EKG Sinus Arrythmia w/o acute ST-T changes. Rhythm strip w/o SVT or PAC's  Recommend transfer to ED for chest pain. Explained that she would be better to receive higher level of care.    Deatra CanterWilliam J Jaaziah Schulke, FNP 03/21/16 1312

## 2016-03-25 ENCOUNTER — Ambulatory Visit (INDEPENDENT_AMBULATORY_CARE_PROVIDER_SITE_OTHER): Payer: BLUE CROSS/BLUE SHIELD | Admitting: Family Medicine

## 2016-03-25 ENCOUNTER — Encounter: Payer: Self-pay | Admitting: Family Medicine

## 2016-03-25 VITALS — BP 128/80 | HR 82 | Temp 98.2°F | Ht 63.0 in | Wt 171.4 lb

## 2016-03-25 DIAGNOSIS — R002 Palpitations: Secondary | ICD-10-CM

## 2016-03-25 DIAGNOSIS — R0982 Postnasal drip: Secondary | ICD-10-CM

## 2016-03-25 DIAGNOSIS — K219 Gastro-esophageal reflux disease without esophagitis: Secondary | ICD-10-CM

## 2016-03-25 LAB — TSH: TSH: 0.96 u[IU]/mL (ref 0.35–4.50)

## 2016-03-25 MED ORDER — OMEPRAZOLE 40 MG PO CPDR: 40.0000 mg | DELAYED_RELEASE_CAPSULE | Freq: Every day | ORAL | 3 refills | Status: DC

## 2016-03-25 MED ORDER — IPRATROPIUM BROMIDE 0.03 % NA SOLN: 2.0000 | Freq: Four times a day (QID) | NASAL | 6 refills | Status: AC

## 2016-03-25 MED ORDER — SUCRALFATE 1 G PO TABS: 1.0000 g | ORAL_TABLET | Freq: Three times a day (TID) | ORAL | 0 refills | Status: DC

## 2016-03-25 NOTE — Patient Instructions (Signed)
We will arrange for an echocardiogram (heart ultrasound) asap.  I think you can continue to exercise but do not push yourself to the point of chest discomfort We will test you for a thyroid issues and also H pylori bacteria today Try the carafate with meals/ bedtime for 10 days and the prilosec daily for about one month for GERD The atrovent nasal can also be helpful for post nasal drip Let me know if any worsening, change in symptoms or other concerns in the meantime!

## 2016-03-25 NOTE — Progress Notes (Signed)
[facility] [address], [address] [place], [place] [address] [phone]  Date:  03/25/2016   Name:  Diane Greene   DOB:  07-30-1980   MRN:  [id number]  PCP:  Abbe Amsterdam, MD    Chief Complaint: No chief complaint on file.   History of Present Illness:  Diane Greene is a 36 y.o. very pleasant female patient who presents with the following:  Last seen by myself in September of 2017 for her CPE. At that time she was doing well However over the last 3-4 weeks she has awoken at night with heart palpitations.  This will wake her up, she will feel "like my heart if beating out of my chest and I will start having a panic attack."  She has used some of the xanax I gave her for flying and this helps to calm her sx She had a more persistent series of episodes last week which led to her calling in and going to UC. He EKG looked ok but she ended up in the ER as she also had complaint of soreness in her left chest.   Her eval there was reassuring and she was allowed to go home She has been set up to see cardiology on 2/2  She does exercise 3-4x a week at [facility]- she feels like it takes her longer to recover from a "heart rate spike" than her classmates.  However she is still able to do her work- outs without increased difficulty and does feel that she is getting to be in better shape.  She started this work- out routine in September.   She has not noted any exercise intolerance  She is a never smoker, rarely drinks alcohol  EKG: most recent 1/12- reviewed  She does have GERD and PND- neither of these are new.  She has noted some constipation as of late, she has noted some belching and bubbling in her stomach.  She uses zantac just as needed.  She has noted a feeling of burning in her esophagus on and off recently.   She also noted PND that is worse at night when she is supine  No syncope  The palpitations had only  occurred at night- she did have one episode during the day last week She will notice SOB, dizziness, nausea, cold and clammy feeling. She will feel like her heart is fast and racing- does not notice a feeling of irregularity  Her brother has a heart arrhythmia, she is not sure of details but nothing needed to be done for this  She did have labs on 1/11- BMP, CBC, troponin all looked ok She is not aware of any abnl stressors at her job  We did do an h pylori breath test in 05/2015- negative No CAD in her family but 2 maternal aunts did have DVT.  She has never had a DVT or PE herself.  She is not aware of any history of coagulation disorder in the family  LMP - she uses 3 month OCP packs,  Had an MP last month   Dg Chest 2 View  Result Date: 03/21/2016 CLINICAL DATA:  Palpitations. EXAM: CHEST  2 VIEW COMPARISON:  No recent prior. FINDINGS: Pectus deformity . Mediastinum and hilar structures are normal. Mild basilar subsegmental atelectasis. No pleural effusion pneumothorax. Heart size normal. IMPRESSION: Mild basilar subsegmental atelectasis. Electronically Signed   By: Maisie Fus  Register   On: 03/21/2016 14:25  Patient Active Problem List   Diagnosis Date Noted  . Overweight 11/15/2015    Past Medical History:  Diagnosis Date  . Acid reflux   . Irritable bowel   . Migraine   . Other lactose intolerance    dairy sensitivity    Past Surgical History:  Procedure Laterality Date  . APPENDECTOMY  2005  . WISDOM TOOTH EXTRACTION      Social History  Substance Use Topics  . Smoking status: Never Smoker  . Smokeless tobacco: Never Used  . Alcohol use 0.0 oz/week     Comment: wine 2-3 drinks monthly    Family History  Problem Relation Age of Onset  . Arthritis Mother   . Arthritis Father   . Irritable bowel syndrome Sister   . Irritable bowel syndrome Maternal Aunt   . Crohn's disease Paternal Uncle     No Known Allergies  Medication list has been reviewed and  updated.  Current Outpatient Prescriptions on File Prior to Visit  Medication Sig Dispense Refill  . ALPRAZolam (XANAX) 0.25 MG tablet Take 1 tablet (0.25 mg total) by mouth 3 (three) times daily as needed for anxiety. For fear of flying 15 tablet 0  . polyethylene glycol powder (GLYCOLAX/MIRALAX) powder Take 255 g by mouth daily. 255 g 3  . QUASENSE 0.15-0.03 MG tablet Take 1 tablet by mouth daily.  3  . ranitidine (ZANTAC) 150 MG tablet Take 1 tablet (150 mg total) by mouth every 12 (twelve) hours as needed for heartburn. 60 tablet 3   No current facility-administered medications on file prior to visit.     Review of Systems:  As per HPI- otherwise negative. Exercising regularly No vomiting No fever  Wt Readings from Last 3 Encounters:  03/25/16 171 lb 6.4 oz (77.7 kg)  03/21/16 167 lb (75.8 kg)  11/15/15 167 lb 6.4 oz (75.9 kg)   She did gain a little weight over the holidays but is working on losing this again   Physical Examination:  Blood pressure 128/80, pulse 82, temperature 98.2 F (36.8 C), temperature source Oral, height 5\' 3"  (1.6 m), weight 171 lb 6.4 oz (77.7 kg), SpO2 99 %. Body mass index is 30.36 kg/m.  GEN: WDWN, NAD, Non-toxic, A & O x 3, looks well, overweight HEENT: Atraumatic, Normocephalic. Neck supple. No masses, No LAD.  Bilateral TM wnl, oropharynx normal.  PEERL,EOMI.   Ears and Nose: No external deformity. CV: RRR, No M/G/R. No JVD. No thrill. No extra heart sounds. PULM: CTA B, no wheezes, crackles, rhonchi. No retractions. No resp. distress. No accessory muscle use. ABD: S, NT, ND. No rebound. No HSM. EXTR: No c/c/e NEURO Normal gait.  PSYCH: Normally interactive. Conversant. Not depressed or anxious appearing.  Calm demeanor.    Assessment and Plan: Heart palpitations - Plan: TSH, ECHOCARDIOGRAM COMPLETE  Gastroesophageal reflux disease, esophagitis presence not specified - Plan: H. pylori breath test, sucralfate (CARAFATE) 1 g tablet,  omeprazole (PRILOSEC) 40 MG capsule  PND (post-nasal drip) - Plan: ipratropium (ATROVENT) 0.03 % nasal spray  Here today to follow-up palpitations.  [facility]; eval reassuring.   She has cardiology follow-up planned.  Will order echo as they will probably want this study in a pt of this age Her sx may be triggered by GERD or PND- will repeat H pylori breath test and treat with prilosec, carafate and atrovent nasal as above  We will arrange for an echocardiogram (heart ultrasound) asap.  I think you can continue to exercise but do not push yourself to the point of chest discomfort We will test you for a thyroid issues and also H pylori bacteria today Try the carafate with meals/ bedtime for 10 days and the prilosec daily for about one month for GERD The atrovent nasal can also be helpful for post nasal drip Let me know if any worsening, change in symptoms or other concerns in the meantime!   Signed Abbe Amsterdam, MD

## 2016-03-26 ENCOUNTER — Encounter: Payer: Self-pay | Admitting: Family Medicine

## 2016-03-26 LAB — H. PYLORI BREATH TEST: H. pylori Breath Test: NOT DETECTED

## 2016-03-27 ENCOUNTER — Ambulatory Visit (HOSPITAL_BASED_OUTPATIENT_CLINIC_OR_DEPARTMENT_OTHER): Payer: BLUE CROSS/BLUE SHIELD

## 2016-04-03 ENCOUNTER — Ambulatory Visit (HOSPITAL_BASED_OUTPATIENT_CLINIC_OR_DEPARTMENT_OTHER)
Admission: RE | Admit: 2016-04-03 | Discharge: 2016-04-03 | Disposition: A | Discharge status: Home / Self Care | Payer: BLUE CROSS/BLUE SHIELD | Source: Ambulatory Visit | Attending: Family Medicine | Admitting: Family Medicine

## 2016-04-03 DIAGNOSIS — R002 Palpitations: Secondary | ICD-10-CM | POA: Insufficient documentation

## 2016-04-03 NOTE — Progress Notes (Signed)
  Echocardiogram 2D Echocardiogram has been performed.  Diane Greene 04/03/2016, 1:45 PM

## 2016-04-12 ENCOUNTER — Ambulatory Visit (INDEPENDENT_AMBULATORY_CARE_PROVIDER_SITE_OTHER): Payer: BLUE CROSS/BLUE SHIELD | Admitting: Interventional Cardiology

## 2016-04-12 ENCOUNTER — Encounter: Payer: Self-pay | Admitting: Interventional Cardiology

## 2016-04-12 VITALS — BP 144/88 | HR 94 | Ht 63.0 in | Wt 171.8 lb

## 2016-04-12 DIAGNOSIS — R002 Palpitations: Secondary | ICD-10-CM | POA: Diagnosis not present

## 2016-04-12 DIAGNOSIS — R0789 Other chest pain: Secondary | ICD-10-CM | POA: Diagnosis not present

## 2016-04-12 NOTE — Patient Instructions (Signed)
**Note De-Identified Erhard Senske Obfuscation** Medication Instructions:  Same-no changes  Labwork: None  Testing/Procedures: Your physician has recommended that you wear a 30 day event monitor. Event monitors are medical devices that record the heart's electrical activity. Doctors most often us these monitors to diagnose arrhythmias. Arrhythmias are problems with the speed or rhythm of the heartbeat. The monitor is a small, portable device. You can wear one while you do your normal daily activities. This is usually used to diagnose what is causing palpitations/syncope (passing out).    Follow-Up: Based on Even monitor results.     If you need a refill on your cardiac medications before your next appointment, please call your pharmacy.

## 2016-04-12 NOTE — Progress Notes (Signed)
Cardiology Office Note   Date:  04/12/2016   ID:  Diane Greene, DOB [birth year], MRN [id number]  PCP:  Abbe Amsterdam, MD    Chief Complaint  Patient presents with  . Chest Pain    pt states some chest pain last weekend   . New Patient (Initial Visit)  . Shortness of Breath    some      Wt Readings from Last 3 Encounters:  04/12/16 171 lb 12.8 oz (77.9 kg)  03/25/16 171 lb 6.4 oz (77.7 kg)  03/21/16 167 lb (75.8 kg)       History of Present Illness: Diane Greene is a 36 y.o. female  Who has had palpitaitons intermittently over the past 4 weeks.  One to two times a week, she wakes up in the middle of the night feeling severe palpitations.  She had 2 episodes in the same night so went to the ER.  She notices some soreness in the left chest the morning after her palpitations.  Her w/u in the ER was negative.  SHe also has GERD.  She has issues with chronic congestion.    She works out 3x/week at [employer].  She has been doing that consistently.  She does well with exercise.  HR is at the higher end of the normal range.  She feels that it takes her HR stays higher.  She does not typically get any of the chest soreness at exercise.  It can happen occasionally.  No dizziness or nausea.  No family h/o CAD.  No prior family heart testing except for recent echo.  No structural heart disease.    No smoking.  She drinks one cup of coffee a day.  No excessive caffeine.  No tea or sodas.  Minimizes chocolate.      Past Medical History:  Diagnosis Date  . Acid reflux   . Irritable bowel   . Migraine   . Other lactose intolerance    dairy sensitivity    Past Surgical History:  Procedure Laterality Date  . APPENDECTOMY  2005  . WISDOM TOOTH EXTRACTION       Current Outpatient Prescriptions  Medication Sig Dispense Refill  . ALPRAZolam (XANAX) 0.25 MG tablet Take 1 tablet (0.25 mg total) by mouth 3 (three) times daily as needed for anxiety. For fear of flying  15 tablet 0  . ipratropium (ATROVENT) 0.03 % nasal spray Place 2 sprays into the nose 4 (four) times daily. Use in both nostrils as needed 30 mL 6  . omeprazole (PRILOSEC) 40 MG capsule Take 1 capsule (40 mg total) by mouth daily. 30 capsule 3  . polyethylene glycol powder (GLYCOLAX/MIRALAX) powder Take 255 g by mouth daily. 255 g 3  . QUASENSE 0.15-0.03 MG tablet Take 1 tablet by mouth daily.  3  . ranitidine (ZANTAC) 150 MG tablet Take 1 tablet (150 mg total) by mouth every 12 (twelve) hours as needed for heartburn. 60 tablet 3  . sucralfate (CARAFATE) 1 g tablet Take 1 tablet (1 g total) by mouth 4 (four) times daily -  with meals and at bedtime. 40 tablet 0   No current facility-administered medications for this visit.     Allergies:   Patient has no known allergies.    Social History:  The patient  reports that she has never smoked. She has never used smokeless tobacco. She reports that she drinks alcohol. She reports that she does not use drugs.   Family History:  The  patient's family history includes Arthritis in her father and mother; Crohn's disease in her paternal uncle; Irritable bowel syndrome in her maternal aunt and sister.    ROS:  Please see the history of present illness.   Otherwise, review of systems are positive for palpitations as noted above; IBS sx for years.   All other systems are reviewed and negative.    PHYSICAL EXAM: VS:  BP (!) 144/88   Pulse 94   Ht 5\' 3"  (1.6 m)   Wt 171 lb 12.8 oz (77.9 kg)   SpO2 98%   BMI 30.43 kg/m  , BMI Body mass index is 30.43 kg/m. GEN: Well nourished, well developed, in no acute distress  HEENT: normal  Neck: no JVD, carotid bruits, or masses Cardiac: RRR; no murmurs, rubs, or gallops,no edema  Respiratory:  clear to auscultation bilaterally, normal work of breathing GI: soft, nontender, nondistended, + BS MS: no deformity or atrophy  Skin: warm and dry, no rash Neuro:  Strength and sensation are intact Psych: euthymic  mood, full affect   EKG:   The ekg ordered Jan 11 demonstrates normal ECG   Recent Labs: 11/15/2015: ALT 21 03/21/2016: BUN 7; Creatinine, Ser 0.66; Hemoglobin 13.5; Platelets 298; Potassium 3.7; Sodium 137 03/25/2016: TSH 0.96   Lipid Panel    Component Value Date/Time   CHOL 265 (H) 11/15/2015 1009   TRIG 187.0 (H) 11/15/2015 1009   HDL 42.10 11/15/2015 1009   CHOLHDL 6 11/15/2015 1009   VLDL 37.4 11/15/2015 1009   LDLCALC 186 (H) 11/15/2015 1009     Other studies Reviewed: Additional studies/ records that were reviewed today with results demonstrating: Jan 24 echo: no structural heart disease.   ASSESSMENT AND PLAN:  1. Palpitations: We'll plan for 30 day event monitor to evaluate her palpitations. No worrisome features such as lightheadedness or syncope with palpitations. Normal structural heart by echocardiogram which reduces the likelihood of more serious arrhythmia.  2. Chest discomfort: Atypical. Will evaluate rhythm disturbance to see if there may be some connection. 3. Hyperlipidemia: She is working on lifestyle modifications to try to reduce her overall risk. She is trying to lose weight and eat healthier. LDL checked in September was 186. Ideally, her LDL should be below 160. Hopefully, she can do this with lifestyle modifications. This will be followed with her primary care doctor.   Current medicines are reviewed at length with the patient today.  The patient concerns regarding her medicines were addressed.  The following changes have been made:  No change  Labs/ tests ordered today include: event monitor No orders of the defined types were placed in this encounter.   Recommend 150 minutes/week of aerobic exercise Low fat, low carb, high fiber diet recommended  Disposition:   FU in based on monitor results   Signed, Lance MussJayadeep Matthew Pais, MD  04/12/2016 9:16 AM    [facility] [address], [place], [place]  [address] Phone: [phone])  [phone]; Fax: [phone]) [phone]

## 2016-04-15 ENCOUNTER — Encounter: Payer: Self-pay | Admitting: *Deleted

## 2016-04-15 NOTE — Progress Notes (Signed)
Patient ID: Frutoso SchatzLauren E Delsignore, female   DOB: 1980/04/01, 36 y.o.   MRN: [id number] Patient enrolled for [employer] to mail a Body Guardian 30 day cardiac event monitor to the home.

## 2016-04-19 ENCOUNTER — Encounter: Payer: Self-pay | Admitting: Internal Medicine

## 2016-04-19 ENCOUNTER — Ambulatory Visit: Payer: BLUE CROSS/BLUE SHIELD

## 2016-04-22 ENCOUNTER — Ambulatory Visit (INDEPENDENT_AMBULATORY_CARE_PROVIDER_SITE_OTHER): Payer: BLUE CROSS/BLUE SHIELD

## 2016-04-22 DIAGNOSIS — R002 Palpitations: Secondary | ICD-10-CM | POA: Diagnosis not present

## 2016-04-26 ENCOUNTER — Telehealth: Payer: Self-pay

## 2016-04-26 NOTE — Telephone Encounter (Signed)
Fax arrived from [employer] for patient stating that last night 7:24 PM (EST) the patient was in SVT (26 sec), Sinus Tachycardia w/ Artifact. Patient was called and the patient states that she was working out in an high intensity interval training class during that time. She states that she did not have any abnormal symptoms other than the normal symptoms of feeling tired from working out. DOD Dr. Ladona Ridgelaylor reviewed, patient in Sinus Tach.

## 2016-04-30 DIAGNOSIS — R002 Palpitations: Secondary | ICD-10-CM | POA: Insufficient documentation

## 2016-05-21 ENCOUNTER — Other Ambulatory Visit: Payer: Self-pay | Admitting: *Deleted

## 2016-05-22 ENCOUNTER — Ambulatory Visit: Payer: BLUE CROSS/BLUE SHIELD

## 2016-05-23 ENCOUNTER — Other Ambulatory Visit: Payer: Self-pay | Admitting: Emergency Medicine

## 2016-05-23 ENCOUNTER — Ambulatory Visit (INDEPENDENT_AMBULATORY_CARE_PROVIDER_SITE_OTHER): Payer: BLUE CROSS/BLUE SHIELD | Admitting: Family Medicine

## 2016-05-23 VITALS — BP 124/86 | HR 76 | Temp 98.3°F | Ht 63.0 in | Wt 170.0 lb

## 2016-05-23 DIAGNOSIS — R0982 Postnasal drip: Secondary | ICD-10-CM | POA: Diagnosis not present

## 2016-05-23 DIAGNOSIS — E785 Hyperlipidemia, unspecified: Secondary | ICD-10-CM | POA: Diagnosis not present

## 2016-05-23 LAB — LDL CHOLESTEROL, DIRECT: Direct LDL: 175 mg/dL

## 2016-05-23 LAB — LIPID PANEL
CHOL/HDL RATIO: 6
Cholesterol: 258 mg/dL — ABNORMAL HIGH (ref 0–200)
HDL: 40.9 mg/dL (ref >39.00)
NONHDL: 217.39
Triglycerides: 279 mg/dL — ABNORMAL HIGH (ref 0.0–149.0)
VLDL: 55.8 mg/dL — ABNORMAL HIGH (ref 0.0–40.0)

## 2016-05-23 NOTE — Patient Instructions (Signed)
We will repeat your cholesterol panel today and I will be in touch with your results. Continue your good diet and exercise habits!

## 2016-05-23 NOTE — Progress Notes (Signed)
Pre visit review using our clinic review tool, if applicable. No additional management support is needed unless otherwise documented below in the visit note. 

## 2016-05-23 NOTE — Progress Notes (Addendum)
[facility] [address] [place], [place] [address] [phone]  Date:  05/23/2016   Name:  Diane Greene   DOB:  January 25, 1981   MRN:  [id number]  PCP:  Abbe Amsterdam, MD    Chief Complaint: Follow-up (Pt here for 6 month f/u and is fasting for labs. )   History of Present Illness:  Diane Greene is a 36 y.o. very pleasant female patient who presents with the following:  Last seen by myself in January- at that time she had concerns about heart palpitations.  She did see cardiology and underwent an echo (normal) and had an event monitor  She notes that her palpitations are now quiet - she has really not had any sx since February of this year She is using atrovent nasal for PND, and uses zantac as needed for GERD  These things also seem to help with her  She had a few other concerns at our last visit as well-  See HPI from that visit  She is due to today to follow-up on her CHL and is fasting for same She is not on any cholesterol med She notes that her cholesterol has been a bit high for some time Her father and sister are both on cholesterol meds She is taking OCP, prilosec  Wt Readings from Last 3 Encounters:  05/23/16 170 lb (77.1 kg)  04/12/16 171 lb 12.8 oz (77.9 kg)  03/25/16 171 lb 6.4 oz (77.7 kg)    Lipids:    Component Value Date/Time   CHOL 265 (H) 11/15/2015 1009   TRIG 187.0 (H) 11/15/2015 1009   HDL 42.10 11/15/2015 1009   VLDL 37.4 11/15/2015 1009   CHOLHDL 6 11/15/2015 1009     Patient Active Problem List   Diagnosis Date Noted  . Palpitations 04/30/2016  . Overweight 11/15/2015    Past Medical History:  Diagnosis Date  . Acid reflux   . Irritable bowel   . Migraine   . Other lactose intolerance    dairy sensitivity    Past Surgical History:  Procedure Laterality Date  . APPENDECTOMY  2005  . WISDOM TOOTH EXTRACTION      Social History  Substance Use Topics  .  Smoking status: Never Smoker  . Smokeless tobacco: Never Used  . Alcohol use 0.0 oz/week     Comment: wine 2-3 drinks monthly    Family History  Problem Relation Age of Onset  . Arthritis Mother   . Arthritis Father   . Irritable bowel syndrome Sister   . Irritable bowel syndrome Maternal Aunt   . Crohn's disease Paternal Uncle     No Known Allergies  Medication list has been reviewed and updated.  Current Outpatient Prescriptions on File Prior to Visit  Medication Sig Dispense Refill  . ALPRAZolam (XANAX) 0.25 MG tablet Take 1 tablet (0.25 mg total) by mouth 3 (three) times daily as needed for anxiety. For fear of flying 15 tablet 0  . ipratropium (ATROVENT) 0.03 % nasal spray Place 2 sprays into the nose 4 (four) times daily. Use in both nostrils as needed 30 mL 6  . omeprazole (PRILOSEC) 40 MG capsule Take 1 capsule (40 mg total) by mouth daily. 30 capsule 3  . polyethylene glycol powder (GLYCOLAX/MIRALAX) powder Take 255 g by mouth daily. 255 g 3  . QUASENSE 0.15-0.03 MG tablet Take 1 tablet by mouth daily.  3  . ranitidine (ZANTAC) 150  MG tablet Take 1 tablet (150 mg total) by mouth every 12 (twelve) hours as needed for heartburn. 60 tablet 3   No current facility-administered medications on file prior to visit.     Review of Systems:  As per HPI- otherwise negative.   Physical Examination: Vitals:   05/23/16 0859  BP: 124/86  Pulse: 76  Temp: 98.3 F (36.8 C)   Vitals:   05/23/16 0859  Weight: 170 lb (77.1 kg)  Height: 5\' 3"  (1.6 m)   Body mass index is 30.11 kg/m. Ideal Body Weight: Weight in (lb) to have BMI = 25: 140.8  GEN: WDWN, NAD, Non-toxic, A & O x 3, overweight, otherwise appears healthy and well HEENT: Atraumatic, Normocephalic. Neck supple. No masses, No LAD. Ears and Nose: No external deformity. CV: RRR, No M/G/R. No JVD. No thrill. No extra heart sounds. PULM: CTA B, no wheezes, crackles, rhonchi. No retractions. No resp. distress. No  accessory muscle use. EXTR: No c/c/e NEURO Normal gait.  PSYCH: Normally interactive. Conversant. Not depressed or anxious appearing.  Calm demeanor.    Assessment and Plan: Dyslipidemia - Plan: Lipid panel  PND (post-nasal drip)  Repeat CHL panel today.  Plan to calculate her overall CV risk. Se is not eager to start on chl meds so we hope that we will be able to continue to manage her with diet and exercise Her PND/ palpitations/ gerd are much better with current regimen and she recently had a reassuring cardiovascular eval. Continue to monitor   Signed Abbe AmsterdamJessica Hawraa Stambaugh, MD  Reviewed her labs 3/18  Results for orders placed or performed in visit on 05/23/16  Lipid panel  Result Value Ref Range   Cholesterol 258 (H) 0 - 200 mg/dL   Triglycerides 161.0279.0 (H) 0.0 - 149.0 mg/dL   HDL 96.0440.90 >54.09>39.00 mg/dL   VLDL 81.155.8 (H) 0.0 - 91.440.0 mg/dL   Total CHOL/HDL Ratio 6    NonHDL 217.39   LDL cholesterol, direct  Result Value Ref Range   Direct LDL 175.0 mg/dL   Calculated her CV risk- it is under 3% over 10 years.  Letter to pt

## 2016-05-26 ENCOUNTER — Encounter: Payer: Self-pay | Admitting: Family Medicine

## 2017-01-08 ENCOUNTER — Encounter: Payer: BLUE CROSS/BLUE SHIELD | Admitting: Family Medicine

## 2017-01-22 NOTE — Progress Notes (Addendum)
[facility] [address], [address] [place], [place] [address] [phone] [phone]  Date:  01/23/2017   Name:  Diane SchatzLauren E Greene   DOB:  03-31-1980   MRN:  [id number]  PCP:  Pearline Cablesopland, Damauri Minion C, MD    Chief Complaint: Annual Exam (Pt here for CPE. PAP scheduled with [facility] on Dec. 13th. )   History of Present Illness:  Diane Greene is a 36 y.o. very pleasant female patient who presents with the following:  Here today for a CPE I saw her earlier this year for palpitations- she saw cardiology and had a benign evaluation. Her palpiations are now resolved.   We have been following her cholesterol- not on cholesterol meds  Lipids:    Component Value Date/Time   CHOL 258 (H) 05/23/2016 0915   TRIG 279.0 (H) 05/23/2016 0915   HDL 40.90 05/23/2016 0915   LDLDIRECT 175.0 05/23/2016 0915   VLDL 55.8 (H) 05/23/2016 0915   CHOLHDL 6 05/23/2016 0915    History of overweight Wt Readings from Last 3 Encounters:  01/23/17 177 lb (80.3 kg)  05/23/16 170 lb (77.1 kg)  04/12/16 171 lb 12.8 oz (77.9 kg)   Pap: sees her GYN in December Labs: due, she is fasting today  Flu: done Tetanus: UTD  There is no family history of breast cancer She mostly just wants to check her cholesterol today- there is a family history of high cholestenol and several family members are on medication  She is using Krill oil daily and hopes to see the effects here  She is exercising 3-4x a week. She does orange theory workouts  Able to exercise without any undue difficulty   She is sleeping well Reflux does bother her some still- eating gluten makes her worse so she tries to avoid this.   She will wake up at night with gerd sometimes. She tries to avoid eating later at night She uses prilosec maybe every other day and zantac for more immediate sx as needed  She uses the xanax on occasion for flying- she will travel to see [place] in [place]  on a regular basis and does have plans to travel to [place] this summer  NCCSR: I gave her [id number] xanax in September, no other entries recetnly   Patient Active Problem List   Diagnosis Date Noted  . Palpitations 04/30/2016  . Overweight 11/15/2015    Past Medical History:  Diagnosis Date  . Acid reflux   . Irritable bowel   . Migraine   . Other lactose intolerance    dairy sensitivity    Past Surgical History:  Procedure Laterality Date  . APPENDECTOMY  2005  . WISDOM TOOTH EXTRACTION      Social History   Tobacco Use  . Smoking status: Never Smoker  . Smokeless tobacco: Never Used  Substance Use Topics  . Alcohol use: Yes    Alcohol/week: 0.0 oz    Comment: wine 2-3 drinks monthly  . Drug use: No    Family History  Problem Relation Age of Onset  . Arthritis Mother   . Arthritis Father   . Irritable bowel syndrome Sister   . Irritable bowel syndrome Maternal Aunt   . Crohn's disease Paternal Uncle     No Known Allergies  Medication list has been reviewed and updated.  Current Outpatient Medications on File Prior to Visit  Medication Sig Dispense Refill  . ALPRAZolam (XANAX) 0.25  MG tablet Take 1 tablet (0.25 mg total) by mouth 3 (three) times daily as needed for anxiety. For fear of flying 15 tablet 0  . ipratropium (ATROVENT) 0.03 % nasal spray Place 2 sprays into the nose 4 (four) times daily. Use in both nostrils as needed 30 mL 6  . omeprazole (PRILOSEC) 40 MG capsule Take 1 capsule (40 mg total) by mouth daily. 30 capsule 3  . polyethylene glycol powder (GLYCOLAX/MIRALAX) powder Take 255 g by mouth daily. 255 g 3  . QUASENSE 0.15-0.03 MG tablet Take 1 tablet by mouth daily.  3  . ranitidine (ZANTAC) 150 MG tablet Take 1 tablet (150 mg total) by mouth every 12 (twelve) hours as needed for heartburn. 60 tablet 3   No current facility-administered medications on file prior to visit.     Review of Systems:  As per HPI- otherwise negative. No breast or  skin concern She would like to get a routine skin cancer screening however No fever or chills No CP or SOB   Physical Examination: Vitals:   01/23/17 0934  BP: 124/84  Pulse: 77  Temp: 97.9 F (36.6 C)  SpO2: 98%   Vitals:   01/23/17 0934  Weight: 177 lb (80.3 kg)  Height: 5' 4.5" (1.638 m)   Body mass index is 29.91 kg/m. Ideal Body Weight: Weight in (lb) to have BMI = 25: 147.6  GEN: WDWN, NAD, Non-toxic, A & O x 3, overweight, looks well HEENT: Atraumatic, Normocephalic. Neck supple. No masses, No LAD.  Bilateral TM wnl, oropharynx normal.  PEERL,EOMI.   Ears and Nose: No external deformity. CV: RRR, No M/G/R. No JVD. No thrill. No extra heart sounds. PULM: CTA B, no wheezes, crackles, rhonchi. No retractions. No resp. distress. No accessory muscle use. ABD: S, NT, ND, +BS. No rebound. No HSM. EXTR: No c/c/e NEURO Normal gait.  PSYCH: Normally interactive. Conversant. Not depressed or anxious appearing.  Calm demeanor.    Assessment and Plan: Physical exam  Dyslipidemia - Plan: Lipid panel  Screening for diabetes mellitus - Plan: Comprehensive metabolic panel, Hemoglobin A1c  Screening for deficiency anemia - Plan: CBC  Fear of flying - Plan: ALPRAZolam (XANAX) 0.25 MG tablet  Gastroesophageal reflux disease, esophagitis presence not specified - Plan: omeprazole (PRILOSEC) 40 MG capsule  Skin cancer screening - Plan: Ambulatory referral to Dermatology  CPE today She will see OBG next month Labs pending as above Refilled her xanax for occasional use She does use omeprazole on a regular basis- unfortunately has not been able to stop using this. She has seen GI for consultation  Derm referral  Signed Abbe Amsterdam, MD  Received her labs- send letter to pt  Results for orders placed or performed in visit on 01/23/17  CBC  Result Value Ref Range   WBC 5.7 4.0 - 10.5 K/uL   RBC 4.67 3.87 - 5.11 Mil/uL   Platelets 316.0 150.0 - 400.0 K/uL   Hemoglobin  13.8 12.0 - 15.0 g/dL   HCT 16.1 09.6 - 04.5 %   MCV 88.7 78.0 - 100.0 fl   MCHC 33.2 30.0 - 36.0 g/dL   RDW 40.9 81.1 - 91.4 %  Comprehensive metabolic panel  Result Value Ref Range   Sodium 138 135 - 145 mEq/L   Potassium 4.4 3.5 - 5.1 mEq/L   Chloride 103 96 - 112 mEq/L   CO2 27 19 - 32 mEq/L   Glucose, Bld 108 (H) 70 - 99 mg/dL   BUN 11 6 -  23 mg/dL   Creatinine, Ser 0.980.76 0.40 - 1.20 mg/dL   Total Bilirubin 0.4 0.2 - 1.2 mg/dL   Alkaline Phosphatase 61 39 - 117 U/L   AST 12 0 - 37 U/L   ALT 9 0 - 35 U/L   Total Protein 7.4 6.0 - 8.3 g/dL   Albumin 4.2 3.5 - 5.2 g/dL   Calcium 9.3 8.4 - 11.910.5 mg/dL   GFR 14.7892.05 >29.56>60.00 mL/min  Hemoglobin A1c  Result Value Ref Range   Hgb A1c MFr Bld 5.5 4.6 - 6.5 %  Lipid panel  Result Value Ref Range   Cholesterol 251 (H) 0 - 200 mg/dL   Triglycerides 213.0148.0 0.0 - 149.0 mg/dL   HDL 86.5742.60 >84.69>39.00 mg/dL   VLDL 62.929.6 0.0 - 52.840.0 mg/dL   LDL Cholesterol 413179 (H) 0 - 99 mg/dL   Total CHOL/HDL Ratio 6    NonHDL 208.10

## 2017-01-23 ENCOUNTER — Ambulatory Visit (INDEPENDENT_AMBULATORY_CARE_PROVIDER_SITE_OTHER): Payer: BLUE CROSS/BLUE SHIELD | Admitting: Family Medicine

## 2017-01-23 ENCOUNTER — Encounter: Payer: Self-pay | Admitting: Family Medicine

## 2017-01-23 VITALS — BP 124/84 | HR 77 | Temp 97.9°F | Ht 64.5 in | Wt 177.0 lb

## 2017-01-23 DIAGNOSIS — Z131 Encounter for screening for diabetes mellitus: Secondary | ICD-10-CM | POA: Diagnosis not present

## 2017-01-23 DIAGNOSIS — Z0000 Encounter for general adult medical examination without abnormal findings: Secondary | ICD-10-CM

## 2017-01-23 DIAGNOSIS — Z1283 Encounter for screening for malignant neoplasm of skin: Secondary | ICD-10-CM

## 2017-01-23 DIAGNOSIS — E785 Hyperlipidemia, unspecified: Secondary | ICD-10-CM | POA: Diagnosis not present

## 2017-01-23 DIAGNOSIS — Z130 Encounter for screening for diseases of the blood and blood-forming organs and certain disorders involving the immune mechanism: Secondary | ICD-10-CM | POA: Diagnosis not present

## 2017-01-23 DIAGNOSIS — F40243 Fear of flying: Secondary | ICD-10-CM | POA: Diagnosis not present

## 2017-01-23 DIAGNOSIS — K219 Gastro-esophageal reflux disease without esophagitis: Secondary | ICD-10-CM

## 2017-01-23 LAB — CBC
HCT: 41.5 % (ref 36.0–46.0)
Hemoglobin: 13.8 g/dL (ref 12.0–15.0)
MCHC: 33.2 g/dL (ref 30.0–36.0)
MCV: 88.7 fl (ref 78.0–100.0)
PLATELETS: 316 10*3/uL (ref 150.0–400.0)
RBC: 4.67 Mil/uL (ref 3.87–5.11)
RDW: 13 % (ref 11.5–15.5)
WBC: 5.7 10*3/uL (ref 4.0–10.5)

## 2017-01-23 LAB — LIPID PANEL
CHOL/HDL RATIO: 6
Cholesterol: 251 mg/dL — ABNORMAL HIGH (ref 0–200)
HDL: 42.6 mg/dL (ref >39.00)
LDL CALC: 179 mg/dL — AB (ref 0–99)
NONHDL: 208.1
Triglycerides: 148 mg/dL (ref 0.0–149.0)
VLDL: 29.6 mg/dL (ref 0.0–40.0)

## 2017-01-23 LAB — COMPREHENSIVE METABOLIC PANEL
ALT: 9 U/L (ref 0–35)
AST: 12 U/L (ref 0–37)
Albumin: 4.2 g/dL (ref 3.5–5.2)
Alkaline Phosphatase: 61 U/L (ref 39–117)
BILIRUBIN TOTAL: 0.4 mg/dL (ref 0.2–1.2)
BUN: 11 mg/dL (ref 6–23)
CHLORIDE: 103 meq/L (ref 96–112)
CO2: 27 meq/L (ref 19–32)
CREATININE: 0.76 mg/dL (ref 0.40–1.20)
Calcium: 9.3 mg/dL (ref 8.4–10.5)
GFR: 92.05 mL/min (ref >60.00)
Glucose, Bld: 108 mg/dL — ABNORMAL HIGH (ref 70–99)
Potassium: 4.4 mEq/L (ref 3.5–5.1)
SODIUM: 138 meq/L (ref 135–145)
Total Protein: 7.4 g/dL (ref 6.0–8.3)

## 2017-01-23 LAB — HEMOGLOBIN A1C: Hgb A1c MFr Bld: 5.5 % (ref 4.6–6.5)

## 2017-01-23 MED ORDER — OMEPRAZOLE 40 MG PO CPDR: 40.0000 mg | DELAYED_RELEASE_CAPSULE | Freq: Every day | ORAL | 3 refills | Status: AC

## 2017-01-23 MED ORDER — ALPRAZOLAM 0.25 MG PO TABS: 0.2500 mg | ORAL_TABLET | Freq: Three times a day (TID) | ORAL | 0 refills | Status: AC | PRN

## 2017-01-23 NOTE — Patient Instructions (Addendum)
Always a pleasure to see you!  Have a wonderful holiday season, I will be in touch with your labs asap I will also set you up with dermatology for a skin check   Health Maintenance, Female Adopting a healthy lifestyle and getting preventive care can go a long way to promote health and wellness. Talk with your health care provider about what schedule of regular examinations is right for you. This is a good chance for you to check in with your provider about disease prevention and staying healthy. In between checkups, there are plenty of things you can do on your own. Experts have done a lot of research about which lifestyle changes and preventive measures are most likely to keep you healthy. Ask your health care provider for more information. Weight and diet Eat a healthy diet  Be sure to include plenty of vegetables, fruits, low-fat dairy products, and lean protein.  Do not eat a lot of foods high in solid fats, added sugars, or salt.  Get regular exercise. This is one of the most important things you can do for your health. ? Most adults should exercise for at least 150 minutes each week. The exercise should increase your heart rate and make you sweat (moderate-intensity exercise). ? Most adults should also do strengthening exercises at least twice a week. This is in addition to the moderate-intensity exercise.  Maintain a healthy weight  Body mass index (BMI) is a measurement that can be used to identify possible weight problems. It estimates body fat based on height and weight. Your health care provider can help determine your BMI and help you achieve or maintain a healthy weight.  For females 3 years of age and older: ? A BMI below 18.5 is considered underweight. ? A BMI of 18.5 to 24.9 is normal. ? A BMI of 25 to 29.9 is considered overweight. ? A BMI of 30 and above is considered obese.  Watch levels of cholesterol and blood lipids  You should start having your blood tested for  lipids and cholesterol at 35 years of age, then have this test every 5 years.  You may need to have your cholesterol levels checked more often if: ? Your lipid or cholesterol levels are high. ? You are older than 35 years of age. ? You are at high risk for heart disease.  Cancer screening Lung Cancer  Lung cancer screening is recommended for adults 84-82 years old who are at high risk for lung cancer because of a history of smoking.  A yearly low-dose CT scan of the lungs is recommended for people who: ? Currently smoke. ? Have quit within the past 15 years. ? Have at least a 30-pack-year history of smoking. A pack year is smoking an average of one pack of cigarettes a day for 1 year.  Yearly screening should continue until it has been 15 years since you quit.  Yearly screening should stop if you develop a health problem that would prevent you from having lung cancer treatment.  Breast Cancer  Practice breast self-awareness. This means understanding how your breasts normally appear and feel.  It also means doing regular breast self-exams. Let your health care provider know about any changes, no matter how small.  If you are in your 20s or 30s, you should have a clinical breast exam (CBE) by a health care provider every 1-3 years as part of a regular health exam.  If you are 17 or older, have a CBE every year.  Also consider having a breast X-ray (mammogram) every year.  If you have a family history of breast cancer, talk to your health care provider about genetic screening.  If you are at high risk for breast cancer, talk to your health care provider about having an MRI and a mammogram every year.  Breast cancer gene (BRCA) assessment is recommended for women who have family members with BRCA-related cancers. BRCA-related cancers include: ? Breast. ? Ovarian. ? Tubal. ? Peritoneal cancers.  Results of the assessment will determine the need for genetic counseling and BRCA1 and  BRCA2 testing.  Cervical Cancer Your health care provider may recommend that you be screened regularly for cancer of the pelvic organs (ovaries, uterus, and vagina). This screening involves a pelvic examination, including checking for microscopic changes to the surface of your cervix (Pap test). You may be encouraged to have this screening done every 3 years, beginning at age 96.  For women ages 7-65, health care providers may recommend pelvic exams and Pap testing every 3 years, or they may recommend the Pap and pelvic exam, combined with testing for human papilloma virus (HPV), every 5 years. Some types of HPV increase your risk of cervical cancer. Testing for HPV may also be done on women of any age with unclear Pap test results.  Other health care providers may not recommend any screening for nonpregnant women who are considered low risk for pelvic cancer and who do not have symptoms. Ask your health care provider if a screening pelvic exam is right for you.  If you have had past treatment for cervical cancer or a condition that could lead to cancer, you need Pap tests and screening for cancer for at least 20 years after your treatment. If Pap tests have been discontinued, your risk factors (such as having a new sexual partner) need to be reassessed to determine if screening should resume. Some women have medical problems that increase the chance of getting cervical cancer. In these cases, your health care provider may recommend more frequent screening and Pap tests.  Colorectal Cancer  This type of cancer can be detected and often prevented.  Routine colorectal cancer screening usually begins at 35 years of age and continues through 35 years of age.  Your health care provider may recommend screening at an earlier age if you have risk factors for colon cancer.  Your health care provider may also recommend using home test kits to check for hidden blood in the stool.  A small camera at the  end of a tube can be used to examine your colon directly (sigmoidoscopy or colonoscopy). This is done to check for the earliest forms of colorectal cancer.  Routine screening usually begins at age 63.  Direct examination of the colon should be repeated every 5-10 years through 35 years of age. However, you may need to be screened more often if early forms of precancerous polyps or small growths are found.  Skin Cancer  Check your skin from head to toe regularly.  Tell your health care provider about any new moles or changes in moles, especially if there is a change in a mole's shape or color.  Also tell your health care provider if you have a mole that is larger than the size of a pencil eraser.  Always use sunscreen. Apply sunscreen liberally and repeatedly throughout the day.  Protect yourself by wearing long sleeves, pants, a wide-brimmed hat, and sunglasses whenever you are outside.  Heart disease, diabetes, and high  blood pressure  High blood pressure causes heart disease and increases the risk of stroke. High blood pressure is more likely to develop in: ? People who have blood pressure in the high end of the normal range (130-139/85-89 mm Hg). ? People who are overweight or obese. ? People who are African American.  If you are 78-1 years of age, have your blood pressure checked every 3-5 years. If you are 71 years of age or older, have your blood pressure checked every year. You should have your blood pressure measured twice-once when you are at a hospital or clinic, and once when you are not at a hospital or clinic. Record the average of the two measurements. To check your blood pressure when you are not at a hospital or clinic, you can use: ? An automated blood pressure machine at a pharmacy. ? A home blood pressure monitor.  If you are between 55 years and 80 years old, ask your health care provider if you should take aspirin to prevent strokes.  Have regular diabetes  screenings. This involves taking a blood sample to check your fasting blood sugar level. ? If you are at a normal weight and have a low risk for diabetes, have this test once every three years after 35 years of age. ? If you are overweight and have a high risk for diabetes, consider being tested at a younger age or more often. Preventing infection Hepatitis B  If you have a higher risk for hepatitis B, you should be screened for this virus. You are considered at high risk for hepatitis B if: ? You were born in a country where hepatitis B is common. Ask your health care provider which countries are considered high risk. ? Your parents were born in a high-risk country, and you have not been immunized against hepatitis B (hepatitis B vaccine). ? You have HIV or AIDS. ? You use needles to inject street drugs. ? You live with someone who has hepatitis B. ? You have had sex with someone who has hepatitis B. ? You get hemodialysis treatment. ? You take certain medicines for conditions, including cancer, organ transplantation, and autoimmune conditions.  Hepatitis C  Blood testing is recommended for: ? Everyone born from [birth year] through 1964. ? Anyone with known risk factors for hepatitis C.  Sexually transmitted infections (STIs)  You should be screened for sexually transmitted infections (STIs) including gonorrhea and chlamydia if: ? You are sexually active and are younger than 35 years of age. ? You are older than 35 years of age and your health care provider tells you that you are at risk for this type of infection. ? Your sexual activity has changed since you were last screened and you are at an increased risk for chlamydia or gonorrhea. Ask your health care provider if you are at risk.  If you do not have HIV, but are at risk, it may be recommended that you take a prescription medicine daily to prevent HIV infection. This is called pre-exposure prophylaxis (PrEP). You are considered at risk  if: ? You are sexually active and do not regularly use condoms or know the HIV status of your partner(s). ? You take drugs by injection. ? You are sexually active with a partner who has HIV.  Talk with your health care provider about whether you are at high risk of being infected with HIV. If you choose to begin PrEP, you should first be tested for HIV. You should then be tested  every 3 months for as long as you are taking PrEP. Pregnancy  If you are premenopausal and you may become pregnant, ask your health care provider about preconception counseling.  If you may become pregnant, take 400 to 800 micrograms (mcg) of folic acid every day.  If you want to prevent pregnancy, talk to your health care provider about birth control (contraception). Osteoporosis and menopause  Osteoporosis is a disease in which the bones lose minerals and strength with aging. This can result in serious bone fractures. Your risk for osteoporosis can be identified using a bone density scan.  If you are 33 years of age or older, or if you are at risk for osteoporosis and fractures, ask your health care provider if you should be screened.  Ask your health care provider whether you should take a calcium or vitamin D supplement to lower your risk for osteoporosis.  Menopause may have certain physical symptoms and risks.  Hormone replacement therapy may reduce some of these symptoms and risks. Talk to your health care provider about whether hormone replacement therapy is right for you. Follow these instructions at home:  Schedule regular health, dental, and eye exams.  Stay current with your immunizations.  Do not use any tobacco products including cigarettes, chewing tobacco, or electronic cigarettes.  If you are pregnant, do not drink alcohol.  If you are breastfeeding, limit how much and how often you drink alcohol.  Limit alcohol intake to no more than 1 drink per day for nonpregnant women. One drink  equals 12 ounces of beer, 5 ounces of wine, or 1 ounces of hard liquor.  Do not use street drugs.  Do not share needles.  Ask your health care provider for help if you need support or information about quitting drugs.  Tell your health care provider if you often feel depressed.  Tell your health care provider if you have ever been abused or do not feel safe at home. This information is not intended to replace advice given to you by your health care provider. Make sure you discuss any questions you have with your health care provider. Document Released: 09/10/2010 Document Revised: 08/03/2015 Document Reviewed: 11/29/2014 Elsevier Interactive Patient Education  [employer].

## 2017-01-29 ENCOUNTER — Encounter: Payer: Self-pay | Admitting: Family Medicine

## 2017-01-29 DIAGNOSIS — E785 Hyperlipidemia, unspecified: Secondary | ICD-10-CM

## 2017-02-04 MED ORDER — ATORVASTATIN CALCIUM 10 MG PO TABS: 10.0000 mg | ORAL_TABLET | Freq: Every day | ORAL | 3 refills | Status: AC

## 2017-04-30 ENCOUNTER — Telehealth: Payer: Self-pay | Admitting: *Deleted

## 2017-04-30 NOTE — Telephone Encounter (Signed)
Received Dermatopathology Report results from [facility]; forwarded to provider/SLS 02/20    

## 2018-02-11 ENCOUNTER — Telehealth: Payer: Self-pay | Admitting: *Deleted

## 2018-02-11 NOTE — Telephone Encounter (Signed)
Received request for Medical Records from [facility]; forwarded to Medical Records via email/scan/SLS 12/04

## 2018-06-04 IMAGING — CT CT ABD-PELV W/ CM
2 of 4 series · 15 of 46 positions shown, 17 images · IV contrast (ISOVUE 300)
Comparison: CT 10/03/2003

CLINICAL DATA: Acute onset of left lower quadrant abdominal pain
with nausea yesterday. Antibiotic started yesterday. History of
irritable bowel syndrome. Evaluate for diverticulitis.

EXAM:
CT ABDOMEN AND PELVIS WITH CONTRAST
TECHNIQUE: Multidetector CT imaging of the abdomen and pelvis was performed
using the standard protocol following bolus administration of
intravenous contrast.
CONTRAST:  100mL [id number] IOPAMIDOL (J511LV-NBB) INJECTION 61%

[Series 2: abd/ pelvis · axial · 0.72mm/px · z∈[-462,-56]mm · 12 of 93 slices shown, 14 images]
[im 8/93  soft-tissue]
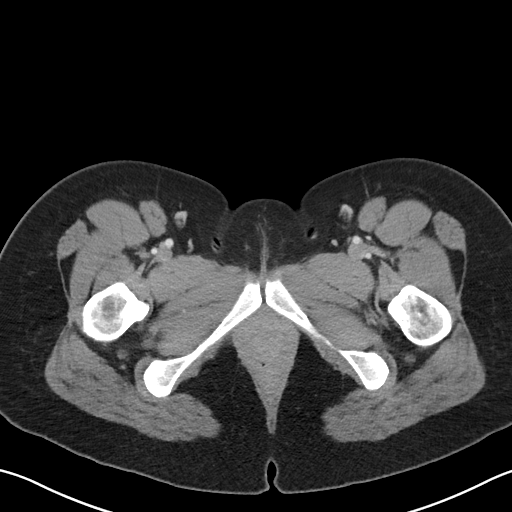
[im 8/93  bone]
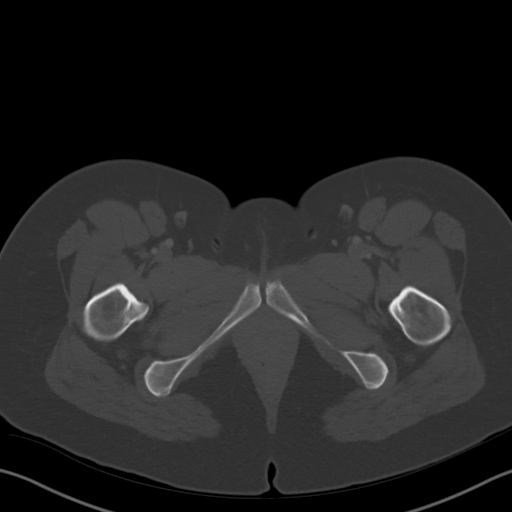
[im 15/93  soft-tissue]
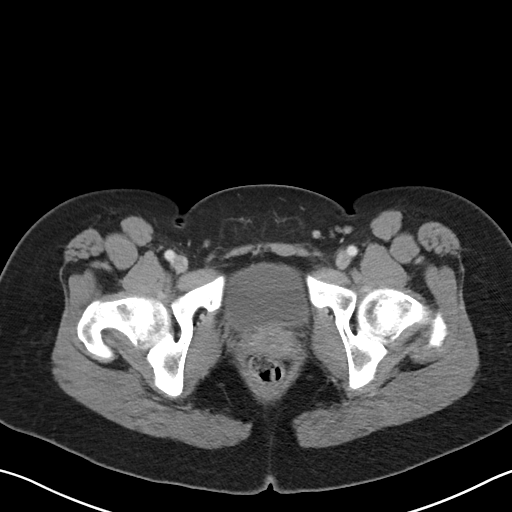
[im 23/93  soft-tissue]
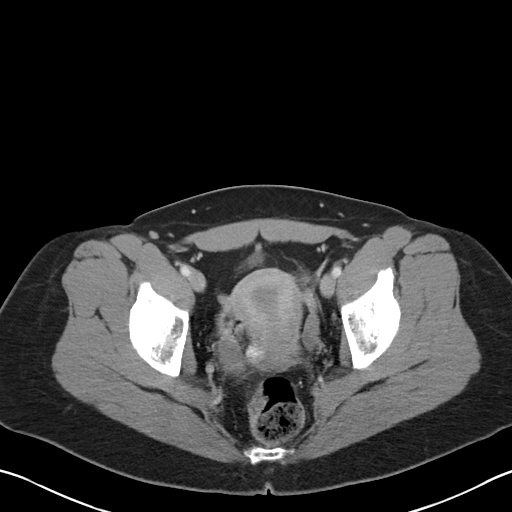
[im 30/93  soft-tissue]
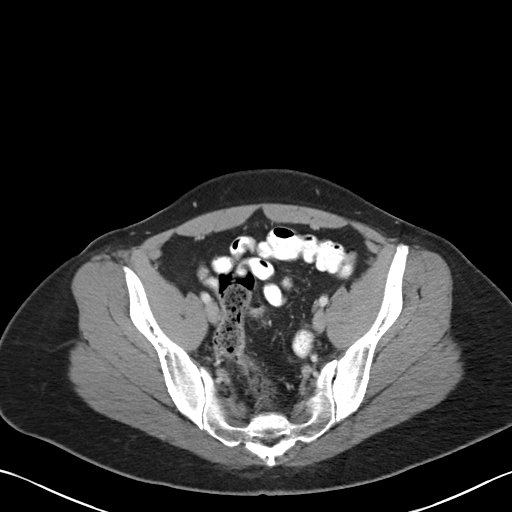
[im 37/93  soft-tissue]
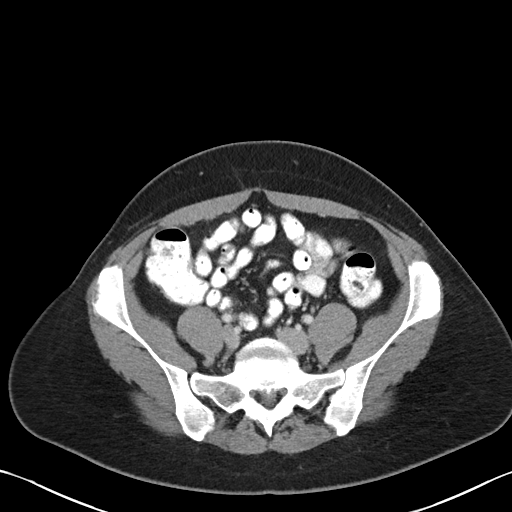
[im 45/93  soft-tissue]
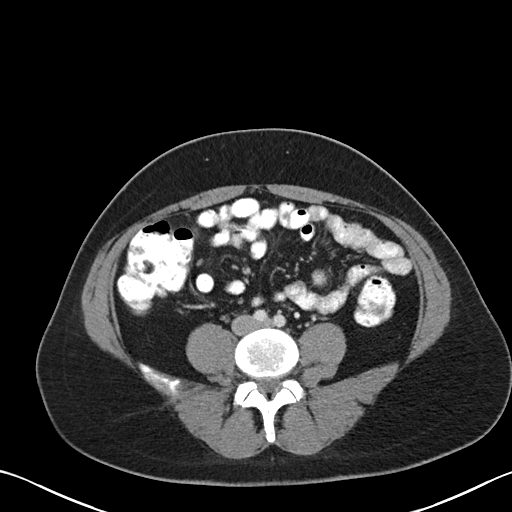
[im 52/93  soft-tissue]
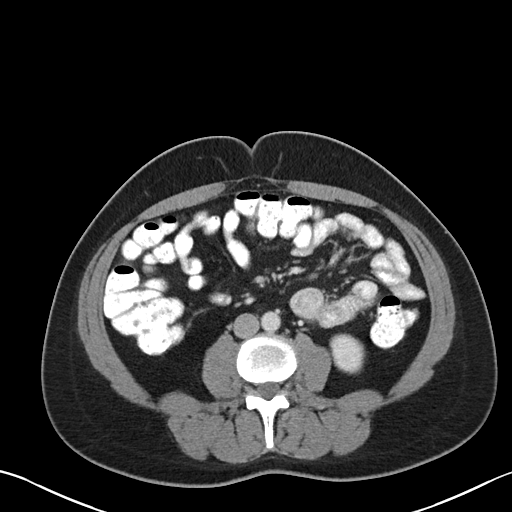
[im 59/93  soft-tissue]
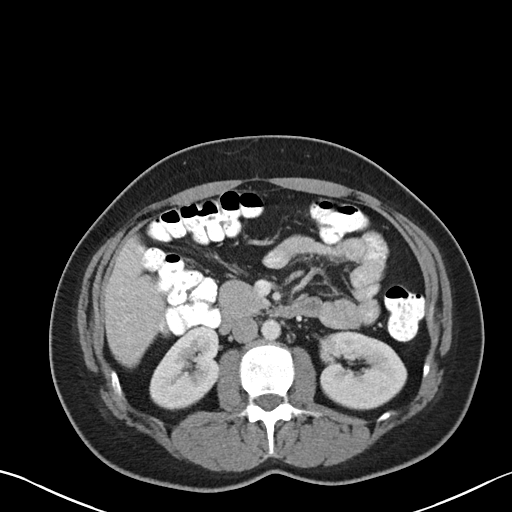
[im 67/93  soft-tissue]
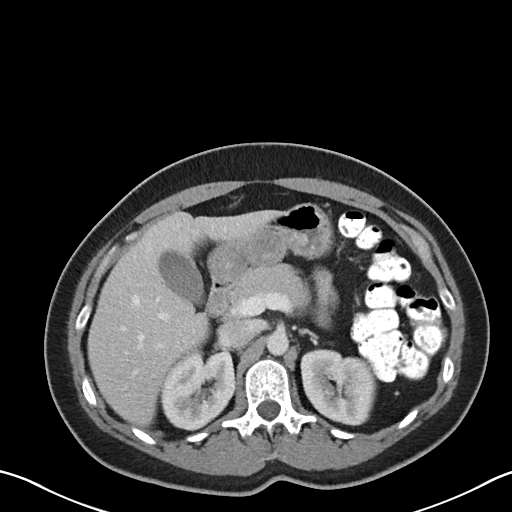
[im 67/93  bone]
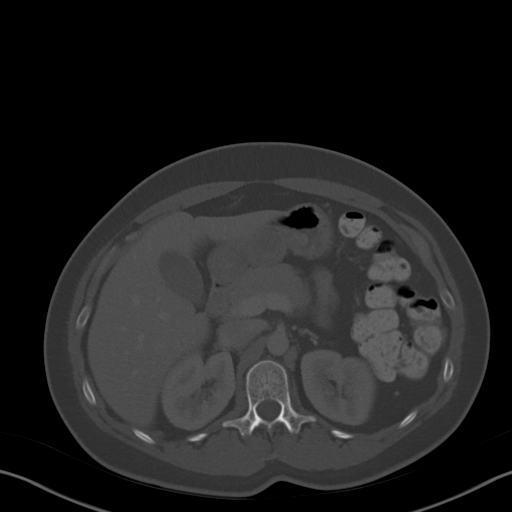
[im 74/93  soft-tissue]
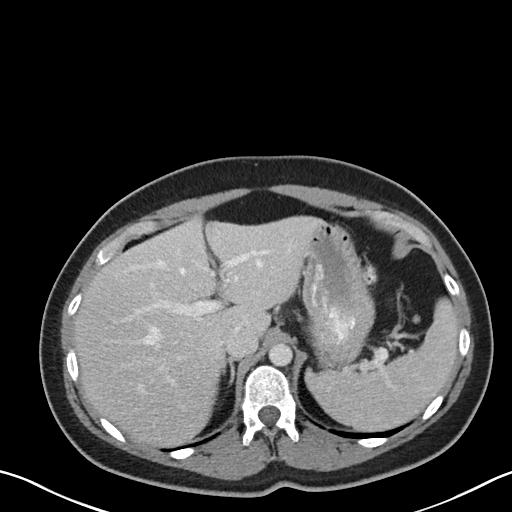
[im 81/93  soft-tissue]
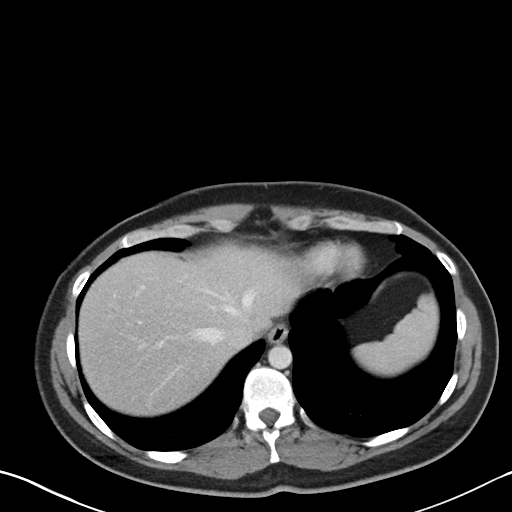
[im 89/93  soft-tissue]
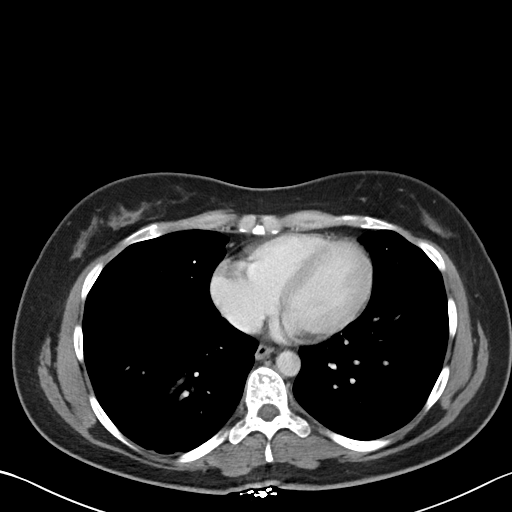

[Series 5: coronal soft tissue · coronal · 0.62mm/px · 3 of 81 slices shown]
[im 27/81  soft-tissue]
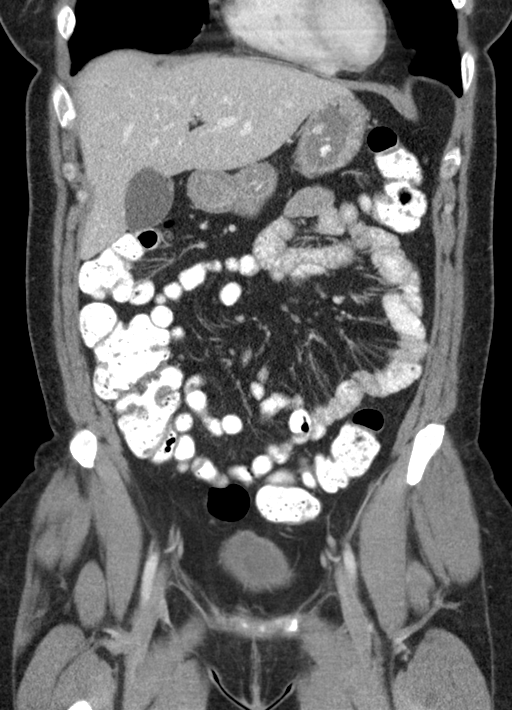
[im 36/81  soft-tissue]
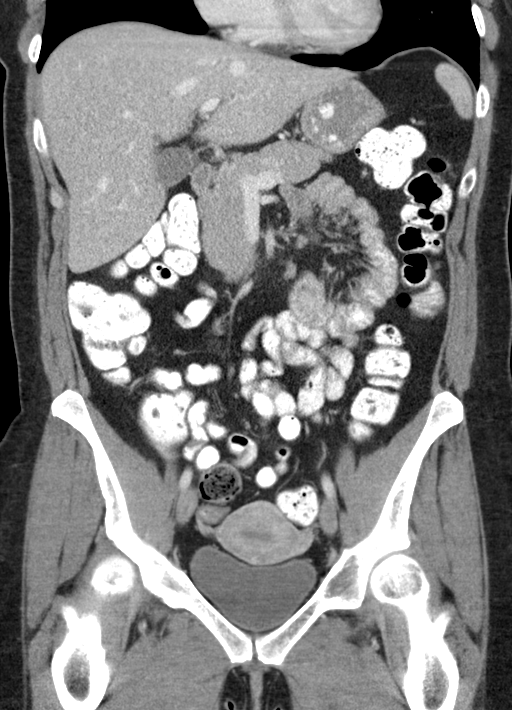
[im 45/81  soft-tissue]
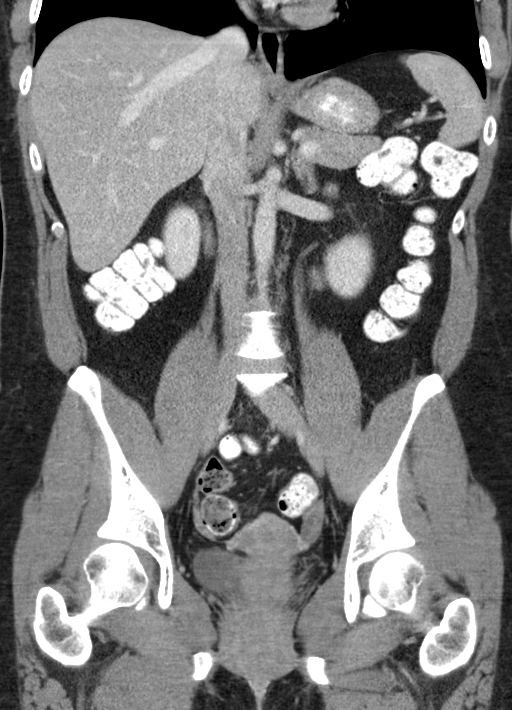

[15 of 46 positions shown; findings below may reference images not displayed]

FINDINGS: Lower chest: Clear lung bases. No significant pleural or pericardial
effusion.

Hepatobiliary: 7 mm low-density lesion anteriorly in the right lobe
on image 23 with not clearly seen previously, although is probably a
small cyst or other incidental finding. There are no suspicious
hepatic findings. No evidence of gallstones, gallbladder wall
thickening or biliary dilatation.

Pancreas: Unremarkable. No pancreatic ductal dilatation or
surrounding inflammatory changes.

Spleen: Normal in size without focal abnormality. There are small
splenules which are unchanged.

Adrenals/Urinary Tract: Both adrenal glands appear normal. The
kidneys appear normal without evidence of urinary tract calculus,
suspicious lesion or hydronephrosis. No bladder abnormalities are
seen.

Stomach/Bowel: No evidence of recurrent bowel wall thickening,
distention or surrounding inflammatory change. Status post
appendectomy. No significant diverticulosis. There is mildly
prominent stool within the rectosigmoid.

Vascular/Lymphatic: There are no enlarged abdominal or pelvic lymph
nodes. No significant vascular findings are present.

Reproductive: The uterus and ovaries appear normal. No evidence of
adnexal mass.

Other: No evidence of abdominal wall mass or hernia.

Musculoskeletal: No acute or significant osseous findings. The
sacroiliac joints appear normal.
IMPRESSION: 1. No acute abdominal pelvic findings. No evidence of
diverticulitis.
2. Previously demonstrated colonic wall thickening has resolved.
3. Probable incidental small cyst in the right hepatic lobe.

## 2018-12-16 IMAGING — CR DG CHEST 2V
2 series · 2 of 2 positions shown · non-contrast
Comparison: No recent prior.

CLINICAL DATA: Palpitations.

EXAM:
CHEST  2 VIEW

[chest pa]
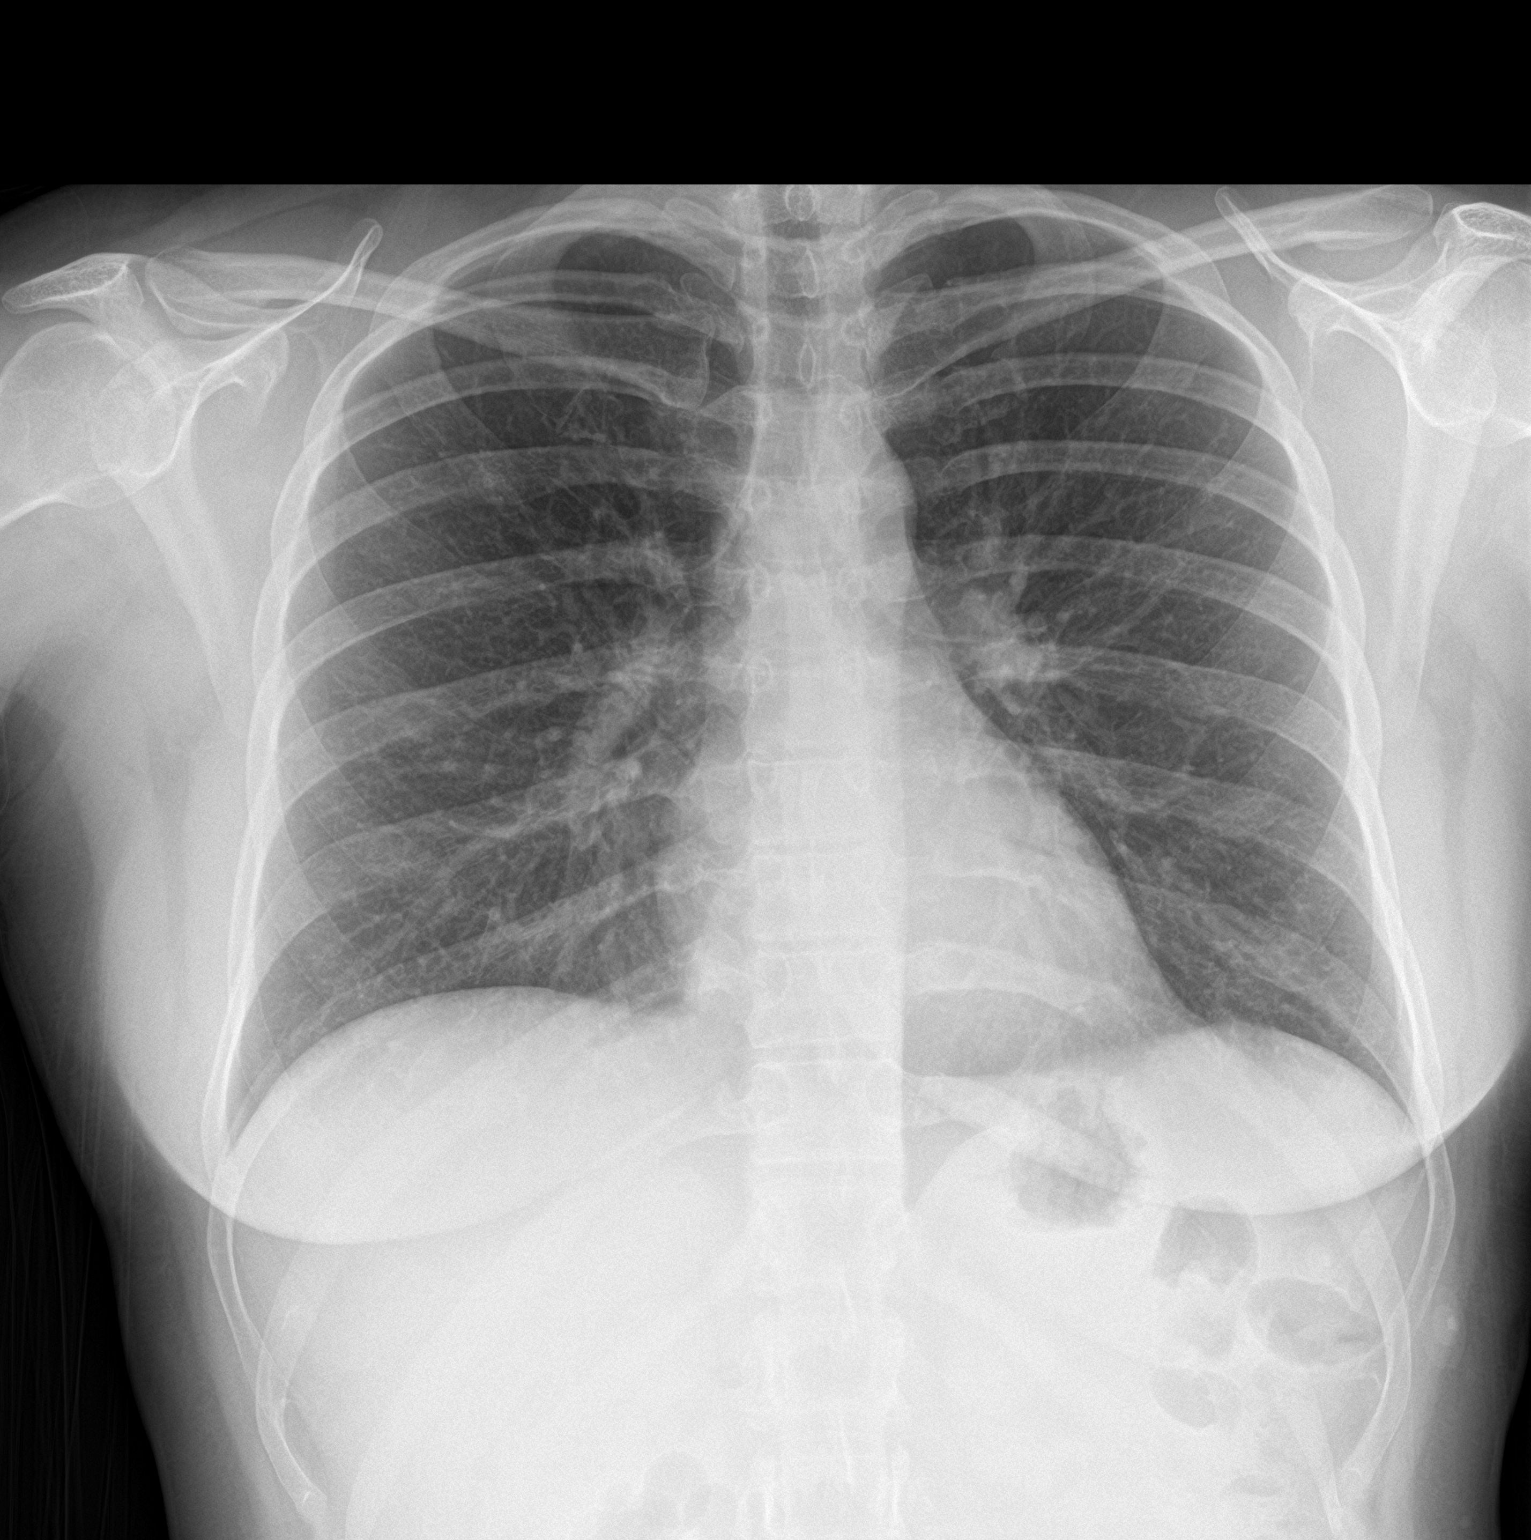

[chest lat]
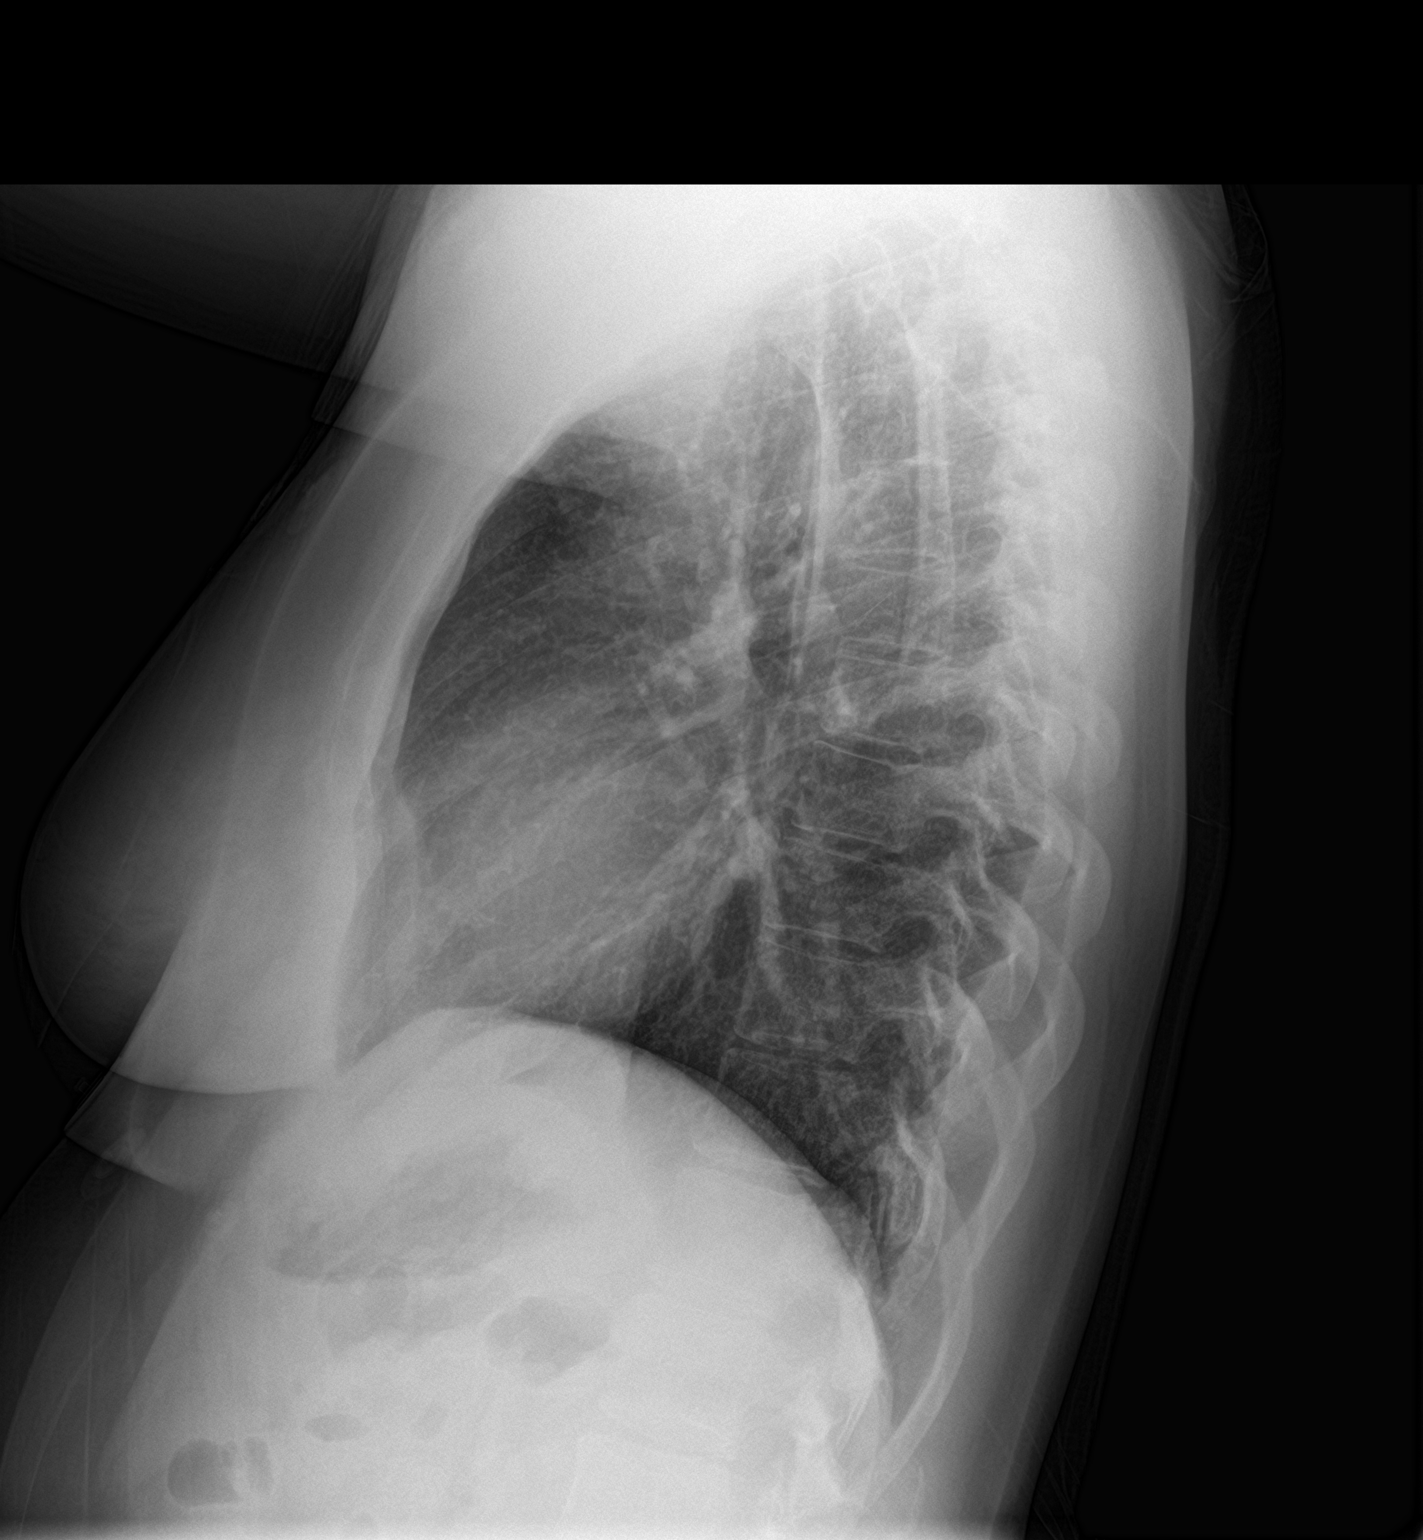

[2 of 2 positions shown; findings below may reference images not displayed]

FINDINGS: Pectus deformity . Mediastinum and hilar structures are normal. Mild
basilar subsegmental atelectasis. No pleural effusion pneumothorax.
Heart size normal.
IMPRESSION: Mild basilar subsegmental atelectasis.
